# Patient Record
Sex: Male | Born: 1985 | Race: Black or African American | Hispanic: No | Marital: Single | Smoking: Never smoker
Health system: Southern US, Community
[De-identification: ages and names within clinical notes are randomized; demographics above are authoritative.]

## PROBLEM LIST (undated history)

## (undated) DIAGNOSIS — J302 Other seasonal allergic rhinitis: Secondary | ICD-10-CM

## (undated) HISTORY — PX: HERNIA REPAIR: SHX51

---
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
## 2006-05-04 ENCOUNTER — Emergency Department (HOSPITAL_COMMUNITY): Admission: EM | Admit: 2006-05-04 | Discharge: 2006-05-04 | Payer: Self-pay | Admitting: Emergency Medicine

## 2006-05-22 ENCOUNTER — Emergency Department (HOSPITAL_COMMUNITY): Admission: EM | Admit: 2006-05-22 | Discharge: 2006-05-22 | Payer: Self-pay | Admitting: Family Medicine

## 2006-07-13 ENCOUNTER — Ambulatory Visit (HOSPITAL_BASED_OUTPATIENT_CLINIC_OR_DEPARTMENT_OTHER): Admission: RE | Admit: 2006-07-13 | Discharge: 2006-07-13 | Payer: Self-pay | Admitting: General Surgery

## 2007-02-08 ENCOUNTER — Encounter: Admission: RE | Admit: 2007-02-08 | Discharge: 2007-02-08 | Payer: Self-pay | Admitting: General Surgery

## 2007-06-15 ENCOUNTER — Ambulatory Visit (HOSPITAL_BASED_OUTPATIENT_CLINIC_OR_DEPARTMENT_OTHER): Admission: RE | Admit: 2007-06-15 | Discharge: 2007-06-15 | Payer: Self-pay | Admitting: Urology

## 2008-04-06 ENCOUNTER — Encounter: Payer: Self-pay | Admitting: Emergency Medicine

## 2008-04-07 ENCOUNTER — Observation Stay (HOSPITAL_COMMUNITY): Admission: AC | Admit: 2008-04-07 | Discharge: 2008-04-07 | Payer: Self-pay
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
## 2010-09-12 ENCOUNTER — Emergency Department (HOSPITAL_COMMUNITY): Admission: EM | Admit: 2010-09-12 | Discharge: 2010-09-12 | Payer: Self-pay | Admitting: Emergency Medicine
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
## 2011-03-11 NOTE — Op Note (Signed)
NAME:  Stuart Mcdonald, Stuart Mcdonald NO.:  [id number]   MEDICAL RECORD NO.:  [id number]          PATIENT TYPE:  AMB   LOCATION:  [facility]                         FACILITY:  [facility]   PHYSICIAN:  Adolph Pollack, M.D.DATE OF BIRTH:  07/02/1986   DATE OF PROCEDURE:  07/13/2006  DATE OF DISCHARGE:                                 OPERATIVE REPORT   PREOPERATIVE DIAGNOSIS:  Right inguinal hernia.   POSTOPERATIVE DIAGNOSIS:  Indirect right inguinal hernia.   PROCEDURE:  Right inguinal hernia repair with mesh.   SURGEON:  Adolph Pollack, M.D.   ANESTHESIA:  General plus local (0.5% Marcaine with epinephrine).   INDICATIONS:  This is a 25 year old male who had been lifting weights and  noticed a bulge in the right groin that has become symptomatic.  He now  presents for repair.   TECHNIQUE:  He is seen in the holding area and the right groin marked with  my initials.  He is then brought to the operating room, placed supine on the  operating room and general anesthetic was administered.  Hair was clipped  from the right groin area and the area was sterilely prepped and draped.  Marcaine solution was infiltrated superficially and deep in the right groin  and right groin incision was made through the skin, subcutaneous tissue and  Scarpa's fascia exposing the external oblique aponeurosis.  Local anesthetic  was infiltrated deep to the external oblique aponeurosis.  An incision was  then made through external ring medially and up toward the anterior superior  iliac spine laterally.  Using blunt dissection, the internal oblique  aponeurosis was exposed superiorly.  The shelving edge of the inguinal  ligament exposed inferiorly.  The ileal, hypogastric and ilioinguinal nerves  were identified and preserved.   The spermatic cord was then isolated and a window created around it.  An  indirect sac was noted to be present and was fairly densely adherent to the  spermatic cord, thus I  had to do a fair amount of manipulation of the  spermatic cord to free up the indirect sac and then reduced to a patulous  internal ring.   Following this a 3 x 6 inch piece of polypropylene mesh was brought into the  field and anchored 1 cm medial to the pubic tubercle with a 2-0 Prolene  suture.  The inferior aspect of the mesh was then anchored to the shelving  edge of the inguinal ligament with running 2-0 Prolene suture up to a level  1-2 cm lateral to the internal ring.   A slit was cut in the mesh and the two tails were wrapped around the cord.  The superior aspect of the mesh was then anchored to the internal oblique  aponeurosis with interrupted 2-0 Vicryl sutures.  Two tails of the mesh were  then crossed creating a new internal ring and these were anchored to the  shelving edge of the inguinal ligament with a single 2-0 Prolene suture.  The new aperture allowed the introduction of the tip of the hemostat and the  cord was mobile.  The lateral aspect of the mesh was then tucked deep to the external oblique  aponeurosis.  Hemostasis was adequate.  I then closed the external oblique  aponeurosis over the mesh and cord with a running 3-0 Vicryl suture.  Scarpa's fascia was closed with running 2-0 Vicryl suture.  Skin was closed  with 4-0 Monocryl subcuticular stitch followed by Steri-Strips and sterile  dressing.   He tolerated the procedure without any apparent complications.  His right  testicle which was noted to be smaller and ride a little bit higher than the  left testicle was incised typical position in the scrotum.  He was then  taken to the recovery room in satisfactory condition.      Adolph Pollack, M.D.  Electronically Signed     TJR/MEDQ  D:  07/13/2006  T:  07/14/2006  Job:  [id number]

## 2011-07-21 LAB — CBC
HCT: 39
Hemoglobin: 13
MCV: 85.7
Platelets: 170
RDW: 13.7
WBC: 11 — ABNORMAL HIGH

## 2011-07-21 LAB — BASIC METABOLIC PANEL
BUN: 6
CO2: 24
Calcium: 9
Creatinine, Ser: 1.01
GFR calc Af Amer: >60
GFR calc non Af Amer: >60

## 2011-08-05 LAB — POCT HEMOGLOBIN-HEMACUE
Hemoglobin: 15
Operator id: 268271

## 2011-12-29 ENCOUNTER — Emergency Department (INDEPENDENT_AMBULATORY_CARE_PROVIDER_SITE_OTHER)
Admission: EM | Admit: 2011-12-29 | Discharge: 2011-12-29 | Disposition: A | Discharge status: Home / Self Care | Payer: Self-pay | Source: Home / Self Care | Attending: Emergency Medicine | Admitting: Emergency Medicine

## 2011-12-29 ENCOUNTER — Encounter (HOSPITAL_COMMUNITY): Payer: Self-pay | Admitting: *Deleted

## 2011-12-29 DIAGNOSIS — N342 Other urethritis: Secondary | ICD-10-CM

## 2011-12-29 LAB — HIV ANTIBODY (ROUTINE TESTING W REFLEX): HIV: NONREACTIVE

## 2011-12-29 MED ORDER — CEFTRIAXONE SODIUM 1 G IJ SOLR
INTRAMUSCULAR | Status: AC
  Filled 2011-12-29: qty 10

## 2011-12-29 MED ORDER — AZITHROMYCIN 250 MG PO TABS
1000.0000 mg | ORAL_TABLET | Freq: Once | ORAL | Status: AC
  Administered 2011-12-29: 1000 mg via ORAL

## 2011-12-29 MED ORDER — AZITHROMYCIN 250 MG PO TABS
ORAL_TABLET | ORAL | Status: AC
  Filled 2011-12-29: qty 4

## 2011-12-29 MED ORDER — CEFTRIAXONE SODIUM 250 MG IJ SOLR
250.0000 mg | Freq: Once | INTRAMUSCULAR | Status: AC
  Administered 2011-12-29: 250 mg via INTRAMUSCULAR

## 2011-12-29 NOTE — ED Notes (Signed)
Pt  States  He  Has  Burning on urination  As  Well  As   Penile discharge   Symptoms  Developed  Yesterday  Pt  Is  In no  Acute  distress  Sitting  Upright on  Exam  Table

## 2011-12-29 NOTE — Discharge Instructions (Signed)
Urethritis, Adult  Urethritis is an inflammation (soreness) of the urethra (the tube exiting from the bladder). It is often caused by germs that may be spread through sexual contact.  TREATMENT   Urethritis will usually respond to antibiotics. These are medications that kill germs. Take all the medicine given to you. You may feel better in a couple days, but TAKE ALL MEDICINE or the infection may not be completely cured and may become more difficult to treat. Response can generally be expected in 7 to 10 days. You may require additional treatment after more testing.  HOME CARE INSTRUCTIONS   Not have sex until the test results are known and treatment is completed.   Know that you may be asked to notify your sex partner when your final test results are back.   Finish all medications as prescribed.   Prevent sexually transmitted infections including AIDS. Practice safe sex. Use condoms.  SEEK MEDICAL CARE IF:    Your symptoms are not improved in 2 to 3 days.   Your symptoms are getting worse.   Your develop abdominal pain.   You develop joint pain.  SEEK IMMEDIATE MEDICAL CARE IF:    You have a fever.   You develop severe pain in the belly, back or side.   You develop repeated vomiting.  TEST RESULTS  Not all test results are available during your visit. If your test results are not back during the visit, make an appointment with your caregiver to find out the results. Do not assume everything is normal if you have not heard from your caregiver or the medical facility. It is important for you to follow-up on all of your test results.  Document Released: 04/05/2001 Document Revised: 09/29/2011 Document Reviewed: 10/26/2009  [employer] Patient Information 2012 [employer]

## 2011-12-29 NOTE — ED Provider Notes (Signed)
Chief Complaint  Patient presents with  . SEXUALLY TRANSMITTED DISEASE    History of Present Illness:   The patient is a 26 year old male who has had a two-day history of urethral discharge which is yellowish in color and pain with urination. He denies any lesions on the penis or testicular pain or swelling. He's had no fever, chills, adenopathy, joint pain, or skin rash. He denies any prior history of STDs. He was sexually active with a single male partner. His last intercourse was 2 weeks ago and he did not use a condom.  Review of Systems:  Other than noted above, the patient denies any of the following symptoms: Systemic:  No fevers chills, aches, weight loss, arthralgias, myalgias, or adenopathy. GI:  No abdominal pain, nausea or vomiting. GU:  No dysuria, penile pain, discharge, itching, dysuria, genital lesions, testicular pain or swelling. Skin:  No rash or itching.  PMFSH:  Past medical history, family history, social history, meds, and allergies were reviewed.  Physical Exam:   Vital signs:  BP 110/70  Pulse 70  Temp(Src) 98.6 F (37 C) (Oral)  Resp 16  SpO2 100% Gen:  Alert, oriented, in no distress. Abdomen:  Soft and flat, non-distended, and non-tender.  No organomegaly or mass. Genital:  There was a thick, yellow urethral discharge. No other lesions on the penis. No testicular swelling, pain, or mass, no inguinal lymphadenopathy. Skin:  Warm and dry.  No rash.   Course in Urgent Care Center:   A urethral swab was obtained for Chlamydia and gonorrhea DNA probe. Also obtained were serologies for HIV and syphilis. He was given Rocephin 250 mg IM and azithromycin 1000 mg by mouth. He tolerated these medications well without any apparent side effects.   Assessment:   Diagnoses that have been ruled out:  None  Diagnoses that are still under consideration:  None  Final diagnoses:  Urethritis - probably due to gonorrhea     Plan:   1.  The following meds were  prescribed:   New Prescriptions   No medications on file   2.  The patient was instructed in symptomatic care and handouts were given. 3.  The patient was told to return if becoming worse in any way, if no better in 3 or 4 days, and given some red flag symptoms that would indicate earlier return. 4.  The patient was instructed to inform all sexual contacts, avoid intercourse completely for 2 weeks and then only with a condom.  The patient was told that we would call about all abnormal lab results, and that we would need to report certain kinds of infection to the health department.    Roque Lias, MD 12/29/11 [phone]

## 2011-12-30 ENCOUNTER — Telehealth (HOSPITAL_COMMUNITY): Payer: Self-pay | Admitting: *Deleted

## 2011-12-30 LAB — GC/CHLAMYDIA PROBE AMP, GENITAL
Chlamydia, DNA Probe: POSITIVE — AB
GC Probe Amp, Genital: NEGATIVE

## 2011-12-30 NOTE — ED Notes (Addendum)
Pt. called back and verified x 2 and given results.  Pt. told he was adequately treated with Rocephin and Zithromax. Pt. instructed to notify their partner, no sex for 1 week and to practice safe sex. Pt. instructed he can come with picture ID Monday-Friday 11:30-1800 to view his HIV result. Vassie Moselle 12/30/2011

## 2011-12-30 NOTE — ED Notes (Signed)
GC neg. Chlamydia pos., HIV and RPR non-reactive.  Pt. Adequately treated with Rocephin and Zithromax. DHHS form completed and faxed to the [facility]. I called and left message for pt. to call. Stuart Mcdonald 12/30/2011

## 2012-01-02 NOTE — ED Notes (Signed)
1620  Pt. came in for HIV result. Pt. verified with picture ID and shown his result. Pt. instructed to get HIV rechecked in 6 mos. at the [facility]D clinic. Pt. voiced understanding. Vassie Moselle 01/02/2012

## 2012-01-29 ENCOUNTER — Emergency Department (INDEPENDENT_AMBULATORY_CARE_PROVIDER_SITE_OTHER)
Admission: EM | Admit: 2012-01-29 | Discharge: 2012-01-29 | Disposition: A | Discharge status: Home / Self Care | Payer: Self-pay | Source: Home / Self Care | Attending: Family Medicine | Admitting: Family Medicine

## 2012-01-29 ENCOUNTER — Encounter (HOSPITAL_COMMUNITY): Payer: Self-pay

## 2012-01-29 DIAGNOSIS — K027 Dental root caries: Secondary | ICD-10-CM

## 2012-01-29 DIAGNOSIS — K011 Impacted teeth: Secondary | ICD-10-CM

## 2012-01-29 DIAGNOSIS — K006 Disturbances in tooth eruption: Secondary | ICD-10-CM

## 2012-01-29 MED ORDER — AMOXICILLIN 875 MG PO TABS: 875.0000 mg | ORAL_TABLET | Freq: Two times a day (BID) | ORAL | Status: AC

## 2012-01-29 MED ORDER — MELOXICAM 15 MG PO TABS: 15.0000 mg | ORAL_TABLET | Freq: Every day | ORAL | Status: AC

## 2012-01-29 MED ORDER — KETOROLAC TROMETHAMINE 60 MG/2ML IM SOLN
60.0000 mg | Freq: Once | INTRAMUSCULAR | Status: AC
  Administered 2012-01-29: 60 mg via INTRAMUSCULAR

## 2012-01-29 MED ORDER — KETOROLAC TROMETHAMINE 60 MG/2ML IM SOLN
INTRAMUSCULAR | Status: AC
  Filled 2012-01-29: qty 2

## 2012-01-29 MED ORDER — HYDROCODONE-ACETAMINOPHEN 7.5-325 MG PO TABS: 1.0000 | ORAL_TABLET | Freq: Four times a day (QID) | ORAL | Status: AC | PRN
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
## 2012-01-29 NOTE — Discharge Instructions (Signed)
Dental Care and Dentist Visits Dental care supports good overall health. Regular dental visits can also help you avoid dental pain, bleeding, infection, and other more serious health problems in the future. It is important to keep the mouth healthy because diseases in the teeth, gums, and other oral tissues can spread to other areas of the body. Some problems, such as diabetes, heart disease, and pre-term labor have been associated with poor oral health.  See your dentist every 6 months. If you experience emergency problems such as a toothache or broken tooth, go to the dentist right away. If you see your dentist regularly, you may catch problems early. It is easier to be treated for problems in the early stages.  WHAT TO EXPECT AT A DENTIST VISIT  Your dentist will look for many common oral health problems and recommend proper treatment. At your regular dental visit, you can expect:  Gentle cleaning of the teeth and gums. This includes scraping and polishing. This helps to remove the sticky substance around the teeth and gums (plaque). Plaque forms in the mouth shortly after eating. Over time, plaque hardens on the teeth as tartar. If tartar is not removed regularly, it can cause problems. Cleaning also helps remove stains.   Periodic X-rays. These pictures of the teeth and supporting bone will help your dentist assess the health of your teeth.   Periodic fluoride treatments. Fluoride is a natural mineral shown to help strengthen teeth. Fluoride treatmentinvolves applying a fluoride gel or varnish to the teeth. It is most commonly done in children.   Examination of the mouth, tongue, jaws, teeth, and gums to look for any oral health problems, such as:   Cavities (dental caries). This is decay on the tooth caused by plaque, sugar, and acid in the mouth. It is best to catch a cavity when it is small.   Inflammation of the gums caused by plaque buildup (gingivitis).   Problems with the mouth or  malformed or misaligned teeth.   Oral cancer or other diseases of the soft tissues or jaws.  KEEP YOUR TEETH AND GUMS HEALTHY For healthy teeth and gums, follow these general guidelines as well as your dentist's specific advice:  Have your teeth professionally cleaned at the dentist every 6 months.   Brush twice daily with a fluoride toothpaste.   Floss your teeth daily.   Ask your dentist if you need fluoride supplements, treatments, or fluoride toothpaste.   Eat a healthy diet. Reduce foods and drinks with added sugar.   Avoid smoking.  TREATMENT FOR ORAL HEALTH PROBLEMS If you have oral health problems, treatment varies depending on the conditions present in your teeth and gums.  Your caregiver will most likely recommend good oral hygiene at each visit.   For cavities, gingivitis, or other oral health disease, your caregiver will perform a procedure to treat the problem. This is typically done at a separate appointment. Sometimes your caregiver will refer you to another dental specialist for specific tooth problems or for surgery.  SEEK IMMEDIATE DENTAL CARE IF:  You have pain, bleeding, or soreness in the gum, tooth, jaw, or mouth area.   A permanent tooth becomes loose or separated from the gum socket.   You experience a blow or injury to the mouth or jaw area.  Document Released: 06/22/2011 Document Revised: 09/29/2011 Document Reviewed: 06/22/2011 [facility] Patient Information 2012 [place], [place].Diet and Dental Disease What you eat affects the health of your teeth. Diet plays an important role in developing  healthy teeth and preventing dental disease, such as:  Tooth decay.   Gum (periodontal) disease.   Developmental defects of the enamel. This is when visible surfaces of the tooth do not form properly, leaving the tooth more prone to decay.   Dental erosion. This is when the teeth wear away.  Knowing which foods promote strong teeth and which foods to stay away  from can help you prevent poor oral health. If your diet lacks proper nutrients, it may be difficult for the tissues in your mouth to prevent dental disease. FOODS THAT PROMOTE DENTAL DISEASE The following foods either contain acids or create acid in your mouth that increases the risk of tooth decay:  Sugary foods, such as candy and baked goods (cookies, cake).   Soft drinks (carbonated and non-carbonated) such as soda, sports drinks, and fruit juice.   Citrus fruits, such as oranges and lemons.   Berries.   Honey.   Herbal teas that contain berries and other fruits.   Wines and other alcoholic beverages.   Vinegar or vinegar containing foods, such as pickles.   Starchy snacks such as crackers, potato chips, [place] fries, and pasta.  Some of these foods have health benefits. Eat these foods in moderation. The more often you eat these foods, the more frequently you are exposing your teeth to the acid that causes dental diseases. FOODS THAT REDUCE THE RISK OF DENTAL DISEASE Certain foods help to keep the teeth strong and reduce the risk of tooth decay. These foods include:  Dairy products, such as cow's milk and cheese. Eating dairy with a meal or sugary snack reduces the risk of tooth decay.   Gums and foods that substitute sugar with sorbitol, mannitol, and xylitol.   Fluoride containing foods, such as black tea. Fluoride is a natural mineral that protects the teeth from tooth decay. Your caregiver may recommend fluoride toothpaste or a fluoride supplement.   Breast milk.  DIETARY RECOMMENDATIONS FOR HEALTHY TEETH  Eat a healthy, well-balanced diet with fiber-rich fruits and vegetables and quality proteins (eggs, meat, poultry, and fish). A variety of foods each day in moderation is best.   Avoid frequent sugary snacks in between meals.   Avoid frequent sticky, chewy, sugary candies, such as gummy bears and other candies that stick to the teeth. Avoid sucking on candies for a  long time.   Avoid drinks that contain added sugar. Even though they do not sit in the mouth for very long, they can promote tooth decay if consumed too frequently.   Avoid sugary foods and drinks late at night.   Avoid swishing or holding acidic or sugary drinks in your mouth. Using a straw limits contact with the teeth.   If you like frequent sugary treats, try eating a sugary dessert after a meal or with a dairy product, rather than eating it by itself.   Avoid starchy foods such as graham crackers that stick to your teeth.   Eat highly acidic and sugary foods in moderation, especially if you tend to develop tooth decay. Eat citrus fruits or drinks 2 times per day or less. Limit foods with vinegar and sports drinks to 1 time per week.   Try rinsing your mouth with water after a sugary or acidic meal or drink. Rinsing may help to reduce the acid buildup in the mouth.   Limit alcohol.   Read labels to determine the amount of sugar in foods.  PRACTICE GOOD DAILY ORAL HYGIENE   Have your  teeth professionally cleaned at the dentist every 6 months.   Brush twice daily with a fluoride toothpaste.   Floss between your teeth daily.   Ask your caregiver if you need fluoride supplements or treatments.   Ask your caregiver if you should have sealants applied to some of your teeth.  HOME CARE INSTRUCTIONS  Follow the guidelines included here to promote good oral health.   Follow all of your caregiver's instructions for managing your health condition(s).   See your caregiver for follow-up exams as directed.  Document Released: 06/08/2011 Document Revised: 09/29/2011 Document Reviewed: 06/08/2011 [facility] Patient Information 2012 [place], [place].Dental Caries  Tooth decay (dental caries, cavities) is the most common of all oral diseases. It occurs in all ages but is more common in children and young adults.  CAUSES  Bacteria in your mouth combine with foods (particularly sugars and  starches) to produce plaque. Plaque is a substance that sticks to the hard surfaces of teeth. The bacteria in the plaque produce acids that attack the enamel of teeth. Repeated acid attacks dissolve the enamel and create holes in the teeth. Root surfaces of teeth may also get these holes.  Other contributing factors include:   Frequent snacking and drinking of cavity-producing foods and liquids.   Poor oral hygiene.   Dry mouth.   Substance abuse such as methamphetamine.   Broken or poor fitting dental restorations.   Eating disorders.   Gastroesophageal reflux disease (GERD).   Certain radiation treatments to the head and neck.  SYMPTOMS  At first, dental decay appears as white, chalky areas on the enamel. In this early stage, symptoms are seldom present. As the decay progresses, pits and holes may appear on the enamel surfaces. Progression of the decay will lead to softening of the hard layers of the tooth. At this point you may experience some pain or achy feeling after sweet, hot, or cold foods or drinks are consumed. If left untreated, the decay will reach the internal structures of the tooth and produce severe pain. Extensive dental treatment, such as root canal therapy, may be needed to save the tooth at this late stage of decay development.  DIAGNOSIS  Most cavities will be detected during regular check-ups. A thorough medical and dental history will be taken by the dentist. The dentist will use instruments to check the surfaces of your teeth for any breakdown or discoloration. Some dentists have special instruments, such as lasers, that detect tooth decay. Dental X-rays may also show some cavities that are not visible to the eye (such as between the contact areas of the teeth). TREATMENT  Treatment involves removal of the tooth decay and replacement with a restorative material such as silver, gold, or composite (white) material. However, if the decay involves a large area of the  tooth and there is little remaining healthy tooth structure, a cap (crown) will be fitted over the remaining structure. If the decay involves the center part of the tooth (pulp), root canal treatment will be needed before any type of dental restoration is placed. If the tooth is severely destroyed by the decay process, leaving the remaining tooth structures unrestorable, the tooth will need to be pulled (extracted). Some early tooth decay may be reversed by fluoride treatments and thorough brushing and flossing at home. PREVENTION   Eat healthy foods. Restrict the amount of sugary, starchy foods and liquids you consume. Avoid frequent snacking and drinking of unhealthy foods and liquids.   Sealants can help with prevention of  cavities. Sealants are composite resins applied onto the biting surfaces of teeth at risk for decay. They smooth out the pits and grooves and prevent food from being trapped in them. This is done in early childhood before tooth decay has started.   Fluoride tablets may also be prescribed to children between 6 months and 59 years of age if your drinking water is not fluoridated. The fluoride absorbed by the tooth enamel makes teeth less susceptible to decay. Thorough daily cleaning with a toothbrush and dental floss is the best way to prevent cavities. Use of a fluoride toothpaste is highly recommended. Fluoride mouth rinses may be used in specific cases.   Topical application of fluoride by your dentist is important in children.   Regular visits with a dentist for checkups and cleanings are also important.  SEEK IMMEDIATE DENTAL CARE IF:  You have a fever.   You develop redness and swelling of your face, jaw, or neck.   You develop swelling around a tooth.   You are unable to open your mouth or cannot swallow.   You have severe pain uncontrolled by pain medicine.  Document Released: 07/02/2002 Document Revised: 09/29/2011 Document Reviewed: 03/17/2011 [facility] Patient  Information 2012 [place], [place].

## 2012-01-29 NOTE — ED Provider Notes (Signed)
History     CSN: [id number]  Arrival date & time 01/29/12  1725   First MD Initiated Contact with Patient 01/29/12 1726      Chief Complaint  Patient presents with  . Dental Pain    toothache    (Consider location/radiation/quality/duration/timing/severity/associated sxs/prior treatment) Patient is a 26 y.o. male presenting with tooth pain. The history is provided by the patient.  Dental PainThe primary symptoms include mouth pain and oral lesions. The symptoms began 3 to 5 days ago. The symptoms are worsening. The symptoms are new.  Additional symptoms include: gum swelling, gum tenderness, facial swelling and swollen glands. Medical issues do not include: alcohol problem, smoking, chewing tobacco, periodontal disease and cancer.    History reviewed. No pertinent past medical history.  Past Surgical History  Procedure Date  . Hernia repair     No family history on file.  History  Substance Use Topics  . Smoking status: Never Smoker   . Smokeless tobacco: Not on file  . Alcohol Use: Yes      Review of Systems  Constitutional: Negative.   HENT: Positive for facial swelling. Negative for rhinorrhea and neck pain.   Neurological: Negative.   All other systems reviewed and are negative.    Allergies  Review of patient's allergies indicates no known allergies.  Home Medications   Current Outpatient Rx  Name Route Sig Dispense Refill  . IBUPROFEN 200 MG PO TABS Oral Take 200 mg by mouth every 6 (six) hours as needed.      BP 136/95  Pulse 84  Temp(Src) 100.9 F (38.3 C) (Oral)  Resp 18  SpO2 99%  Physical Exam  Constitutional: He is oriented to person, place, and time. He appears well-developed and well-nourished.  HENT:  Head: Normocephalic.  Mouth/Throat: Dental caries present.         Dental swelling and irritation over R upper wisdom tooth And cavity in upper R mouth  Eyes: Pupils are equal, round, and reactive to light.  Neck: Neck supple. No  tracheal deviation present. No thyromegaly present.  Neurological: He is alert and oriented to person, place, and time.  Skin: Skin is warm and dry.  Psychiatric: He has a normal mood and affect.    ED Course  Procedures (including critical care time)   Impacted wisdom teeth, dental caries, and dental infectiomn   MDM          Hassan Rowan, MD 01/29/12 2126

## 2017-02-02 ENCOUNTER — Encounter (HOSPITAL_COMMUNITY): Payer: Self-pay | Admitting: Emergency Medicine

## 2017-02-02 ENCOUNTER — Emergency Department (HOSPITAL_COMMUNITY)
Admission: EM | Admit: 2017-02-02 | Discharge: 2017-02-02 | Disposition: A | Discharge status: Home / Self Care | Payer: Self-pay | Attending: Emergency Medicine | Admitting: Emergency Medicine

## 2017-02-02 DIAGNOSIS — Z79899 Other long term (current) drug therapy: Secondary | ICD-10-CM | POA: Insufficient documentation

## 2017-02-02 DIAGNOSIS — R1111 Vomiting without nausea: Secondary | ICD-10-CM

## 2017-02-02 DIAGNOSIS — R1013 Epigastric pain: Secondary | ICD-10-CM | POA: Insufficient documentation

## 2017-02-02 DIAGNOSIS — R112 Nausea with vomiting, unspecified: Secondary | ICD-10-CM | POA: Insufficient documentation

## 2017-02-02 LAB — CBC WITH DIFFERENTIAL/PLATELET
BASOS ABS: 0 10*3/uL (ref 0.0–0.1)
Basophils Relative: 0 %
EOS PCT: 0 %
Eosinophils Absolute: 0 10*3/uL (ref 0.0–0.7)
HCT: 42.1 % (ref 39.0–52.0)
Hemoglobin: 14.7 g/dL (ref 13.0–17.0)
LYMPHS PCT: 10 %
Lymphs Abs: 1 10*3/uL (ref 0.7–4.0)
MCH: 29.2 pg (ref 26.0–34.0)
MCHC: 34.9 g/dL (ref 30.0–36.0)
MCV: 83.7 fL (ref 78.0–100.0)
MONO ABS: 0.4 10*3/uL (ref 0.1–1.0)
Monocytes Relative: 4 %
Neutro Abs: 8.8 10*3/uL — ABNORMAL HIGH (ref 1.7–7.7)
Neutrophils Relative %: 86 %
PLATELETS: 169 10*3/uL (ref 150–400)
RBC: 5.03 MIL/uL (ref 4.22–5.81)
RDW: 13.3 % (ref 11.5–15.5)
WBC: 10.2 10*3/uL (ref 4.0–10.5)

## 2017-02-02 LAB — COMPREHENSIVE METABOLIC PANEL
ALT: 19 U/L (ref 17–63)
ANION GAP: 10 (ref 5–15)
AST: 23 U/L (ref 15–41)
Albumin: 4.4 g/dL (ref 3.5–5.0)
Alkaline Phosphatase: 67 U/L (ref 38–126)
BUN: 6 mg/dL (ref 6–20)
CHLORIDE: 106 mmol/L (ref 101–111)
CO2: 23 mmol/L (ref 22–32)
Calcium: 9.2 mg/dL (ref 8.9–10.3)
Creatinine, Ser: 0.84 mg/dL (ref 0.61–1.24)
Glucose, Bld: 96 mg/dL (ref 65–99)
Potassium: 3.8 mmol/L (ref 3.5–5.1)
Sodium: 139 mmol/L (ref 135–145)
Total Bilirubin: 0.7 mg/dL (ref 0.3–1.2)
Total Protein: 7.8 g/dL (ref 6.5–8.1)

## 2017-02-02 LAB — LIPASE, BLOOD: LIPASE: 23 U/L (ref 11–51)

## 2017-02-02 MED ORDER — OMEPRAZOLE 20 MG PO CPDR: 20.0000 mg | DELAYED_RELEASE_CAPSULE | Freq: Two times a day (BID) | ORAL | 1 refills | Status: DC

## 2017-02-02 MED ORDER — ONDANSETRON 4 MG PO TBDP: 4.0000 mg | ORAL_TABLET | Freq: Three times a day (TID) | ORAL | 0 refills | Status: DC | PRN

## 2017-02-02 MED ORDER — SODIUM CHLORIDE 0.9 % IV BOLUS (SEPSIS)
1000.0000 mL | Freq: Once | INTRAVENOUS | Status: AC
  Administered 2017-02-02: 1000 mL via INTRAVENOUS

## 2017-02-02 MED ORDER — PANTOPRAZOLE SODIUM 40 MG IV SOLR
40.0000 mg | Freq: Once | INTRAVENOUS | Status: AC
Start: 2017-02-02 — End: 2017-02-02
  Administered 2017-02-02: 40 mg via INTRAVENOUS
  Filled 2017-02-02: qty 40

## 2017-02-02 NOTE — ED Notes (Signed)
Bed: WA19 Expected date:  Expected time:  Means of arrival:  Comments: 

## 2017-02-02 NOTE — ED Triage Notes (Signed)
Pt initially c/o abdominal pain and emesis due to drinking multiple beers and multiple shots last night, given Zofran  IV by EMS and now feeling better.

## 2017-02-02 NOTE — ED Notes (Signed)
RN WILL COLLECT LABS AT IV START

## 2017-02-02 NOTE — Discharge Instructions (Signed)
No alcohol, tobacco, anti-inflammatory meds, or caffeine.

## 2017-02-02 NOTE — ED Provider Notes (Addendum)
[facility]-EMERGENCY DEPT Provider Note   CSN: [id number] Arrival date & time: 02/02/17  1048     History   Chief Complaint Chief Complaint  Patient presents with  . Emesis    HPI Stuart Mcdonald is a 31 y.o. male.Chief complaint: abdominal pain  HPI:  Patient presents for evaluation of abdominal pain. States he went out and had several drinks last night. He drinks infrequently. He had vomiting last night. Awakened this morning and had several episodes of emesis and some upper abdominal pain and presents here. Does not drink daily or often. Occasional caffeine. He does not use anti-inflammatories. He smokes "okay" but no marijuana or cigarettes. No history of ulcers. Or past similar episodes.  Heme-negative nonbilious emesis  History reviewed. No pertinent past medical history.  There are no active problems to display for this patient.   Past Surgical History:  Procedure Laterality Date  . HERNIA REPAIR         Home Medications    Prior to Admission medications   Medication Sig Start Date End Date Taking? Authorizing Provider  ibuprofen (ADVIL,MOTRIN) 200 MG tablet Take 400 mg by mouth every 6 (six) hours as needed for fever, headache, mild pain, moderate pain or cramping.   Yes Historical Provider, MD  omeprazole (PRILOSEC) 20 MG capsule Take 1 capsule (20 mg total) by mouth 2 (two) times daily. 02/02/17   Rolland Porter, MD  ondansetron (ZOFRAN ODT) 4 MG disintegrating tablet Take 1 tablet (4 mg total) by mouth every 8 (eight) hours as needed for nausea. 02/02/17   Rolland Porter, MD    Family History History reviewed. No pertinent family history.  Social History Social History  Substance Use Topics  . Smoking status: Never Smoker  . Smokeless tobacco: Never Used  . Alcohol use Yes     Allergies   Patient has no known allergies.   Review of Systems Review of Systems  Constitutional: Negative for appetite change, chills, diaphoresis, fatigue and fever.  HENT: Negative  for mouth sores, sore throat and trouble swallowing.   Eyes: Negative for visual disturbance.  Respiratory: Negative for cough, chest tightness, shortness of breath and wheezing.   Cardiovascular: Negative for chest pain.  Gastrointestinal: Positive for abdominal pain, nausea and vomiting. Negative for abdominal distention and diarrhea.  Endocrine: Negative for polydipsia, polyphagia and polyuria.  Genitourinary: Negative for dysuria, frequency and hematuria.  Musculoskeletal: Negative for gait problem.  Skin: Negative for color change, pallor and rash.  Neurological: Negative for dizziness, syncope, light-headedness and headaches.  Hematological: Does not bruise/bleed easily.  Psychiatric/Behavioral: Negative for behavioral problems and confusion.     Physical Exam Updated Vital Signs BP 115/80 (BP Location: Left Arm)   Pulse 82   Temp 97.9 F (36.6 C) (Oral)   Resp 16   Ht  (1.651 m)   Wt 145 lb (65.8 kg)   SpO2 100%   BMI 24.13 kg/m   Physical Exam  Constitutional: He is oriented to person, place, and time. He appears well-developed and well-nourished. No distress.  HENT:  Head: Normocephalic.  Eyes: Conjunctivae are normal. Pupils are equal, round, and reactive to light. No scleral icterus.  Neck: Normal range of motion. Neck supple. No thyromegaly present.  Cardiovascular: Normal rate and regular rhythm.  Exam reveals no gallop and no friction rub.   No murmur heard. Pulmonary/Chest: Effort normal and breath sounds normal. No respiratory distress. He has no wheezes. He has no rales.  Abdominal: Soft. Bowel sounds are normal.  He exhibits no distension. There is no tenderness. There is no rebound.  Musculoskeletal: Normal range of motion.  Neurological: He is alert and oriented to person, place, and time.  Skin: Skin is warm and dry. No rash noted.  Psychiatric: He has a normal mood and affect. His behavior is normal.     ED Treatments / Results  Labs (all labs  ordered are listed, but only abnormal results are displayed) Labs Reviewed  CBC WITH DIFFERENTIAL/PLATELET - Abnormal; Notable for the following:       Result Value   Neutro Abs 8.8 (*)    All other components within normal limits  COMPREHENSIVE METABOLIC PANEL  LIPASE, BLOOD    EKG  EKG Interpretation None       Radiology No results found.  Procedures Procedures (including critical care time)  Medications Ordered in ED Medications  sodium chloride 0.9 % bolus 1,000 mL (1,000 mLs Intravenous New Bag/Given 02/02/17 1240)  pantoprazole (PROTONIX) injection 40 mg (40 mg Intravenous Given 02/02/17 1240)     Initial Impression / Assessment and Plan / ED Course  I have reviewed the triage vital signs and the nursing notes.  Pertinent labs & imaging results that were available during my care of the patient were reviewed by me and considered in my medical decision making (see chart for details).     Given IV fluids anti-medics. Feeling improved. Reassuring labs. No cirrhosis, hepatitis, pancreatitis. Likely alcohol gastritis. Plan PPI. When necessary Zofran.  Final Clinical Impressions(s) / ED Diagnoses   Final diagnoses:  Vomiting without nausea, intractability of vomiting not specified, unspecified vomiting type  Epigastric pain    New Prescriptions New Prescriptions   OMEPRAZOLE (PRILOSEC) 20 MG CAPSULE    Take 1 capsule (20 mg total) by mouth 2 (two) times daily.   ONDANSETRON (ZOFRAN ODT) 4 MG DISINTEGRATING TABLET    Take 1 tablet (4 mg total) by mouth every 8 (eight) hours as needed for nausea.     Rolland Porter, MD 02/02/17 1402    Rolland Porter, MD 02/02/17 [phone]

## 2017-10-05 ENCOUNTER — Encounter (HOSPITAL_COMMUNITY): Payer: Self-pay | Admitting: Emergency Medicine

## 2017-10-05 ENCOUNTER — Ambulatory Visit (HOSPITAL_COMMUNITY)
Admission: EM | Admit: 2017-10-05 | Discharge: 2017-10-05 | Disposition: A | Discharge status: Home / Self Care | Payer: Self-pay | Attending: Urgent Care | Admitting: Urgent Care

## 2017-10-05 DIAGNOSIS — J3489 Other specified disorders of nose and nasal sinuses: Secondary | ICD-10-CM

## 2017-10-05 DIAGNOSIS — J019 Acute sinusitis, unspecified: Secondary | ICD-10-CM

## 2017-10-05 DIAGNOSIS — G44209 Tension-type headache, unspecified, not intractable: Secondary | ICD-10-CM

## 2017-10-05 MED ORDER — KETOROLAC TROMETHAMINE 60 MG/2ML IM SOLN
60.0000 mg | Freq: Once | INTRAMUSCULAR | Status: AC
  Administered 2017-10-05: 60 mg via INTRAMUSCULAR

## 2017-10-05 MED ORDER — CYCLOBENZAPRINE HCL 5 MG PO TABS: 5.0000 mg | ORAL_TABLET | Freq: Three times a day (TID) | ORAL | 0 refills | Status: DC | PRN

## 2017-10-05 MED ORDER — AMOXICILLIN 500 MG PO CAPS: 500.0000 mg | ORAL_CAPSULE | Freq: Three times a day (TID) | ORAL | 0 refills | Status: DC

## 2017-10-05 MED ORDER — KETOROLAC TROMETHAMINE 60 MG/2ML IM SOLN
INTRAMUSCULAR | Status: AC
  Filled 2017-10-05: qty 2

## 2017-10-05 NOTE — ED Triage Notes (Signed)
Pt sts frequent HA since 11/10;  Pt sts left ear pain and back of head pain today

## 2017-10-05 NOTE — ED Provider Notes (Signed)
  MRN: [id number] DOB: 09/04/1986  Subjective:   Stuart Mcdonald is a 31 y.o. male presenting for 1 month history of persistent and worsening headaches. Headaches have caused pressure around both eyes, can be frontal, temporal or occipital, has had intermittent sinus pain. Now he is having left ear pain, nasal congestion, mild photophobia, mild dry cough, neck pain. Has tried Tylenol, Excedrin, ibuprofen with temporary relief only. Drinks ~1 bottle of water daily. Drinks sodas regularly. Has 1-2 meals per day. Sleeps ~5-6 hours per day, avoids sleeping but does not feel like he is sleep deprived. Denies fever, confusion, dizziness, sore throat. Denies history of migraines or history of headaches. Used to drink alcohol, stopped ~5 months ago. Denies smoking cigarettes.   Stuart Mcdonald is not currently taking any medications and has No Known Allergies.  Stuart Mcdonald has pmh of inguinal hernia and  has a past surgical history that includes Hernia repair.  Objective:   Vitals: BP (!) 143/87 (BP Location: Right Arm)   Pulse 97   Temp 98.6 F (37 C) (Oral)   Resp 18   SpO2 100%   Physical Exam  Constitutional: He is oriented to person, place, and time. He appears well-developed and well-nourished.  HENT:  TM's intact bilaterally, no effusions or erythema. Nasal turbinates pink and moist, nasal passages patent. No sinus tenderness. Oropharynx clear, mucous membranes moist.  Eyes: EOM are normal. Pupils are equal, round, and reactive to light. Right eye exhibits no discharge. Left eye exhibits no discharge.  Neck: Normal range of motion. Neck supple.  Cardiovascular: Normal rate, regular rhythm and intact distal pulses. Exam reveals no gallop and no friction rub.  No murmur heard. Pulmonary/Chest: No respiratory distress. He has no wheezes. He has no rales.  Lymphadenopathy:    He has no cervical adenopathy.  Neurological: He is alert and oriented to person, place, and time. He displays normal reflexes. No  cranial nerve deficit.  Skin: Skin is warm and dry.  Psychiatric: He has a normal mood and affect.   Assessment and Plan :   Acute non intractable tension-type headache  Sinus pain  Acute non-recurrent sinusitis, unspecified location   Will manage as a sinusitis with amoxicillin. Recommended lifestyle modifications in addition. Return-to-clinic precautions discussed, patient verbalized understanding.   Stuart BambergMario Patrcia Schnepp, PA-C [facility]  10/05/2017  4:20 PM   Stuart BambergMani, Osborne Serio, PA-C 10/05/17 1654

## 2017-10-05 NOTE — Discharge Instructions (Signed)
Hydrate well with at least 2 liters of water daily (1 gallon of water). Sleep 7-8 hours daily. Try to eat 3 regular, balanced meals. You may take 500mg  Tylenol with ibuprofen 600mg  with food every 6 hours for pain and inflammation.

## 2018-03-12 ENCOUNTER — Encounter (HOSPITAL_COMMUNITY): Payer: Self-pay | Admitting: Emergency Medicine

## 2018-03-12 ENCOUNTER — Ambulatory Visit (HOSPITAL_COMMUNITY)
Admission: EM | Admit: 2018-03-12 | Discharge: 2018-03-12 | Disposition: A | Discharge status: Home / Self Care | Payer: Self-pay | Attending: Family Medicine | Admitting: Family Medicine

## 2018-03-12 DIAGNOSIS — N509 Disorder of male genital organs, unspecified: Secondary | ICD-10-CM

## 2018-03-12 DIAGNOSIS — N5089 Other specified disorders of the male genital organs: Secondary | ICD-10-CM

## 2018-03-12 NOTE — ED Triage Notes (Signed)
Pt sts 2 small mass on testicles x several weeks with pain with sitting

## 2018-03-13 NOTE — ED Provider Notes (Signed)
[facility]   [id number] 03/12/18 Arrival Time: 1130  ASSESSMENT & PLAN:  1. Scrotal masses    Recommend that he get an U/S. Information given for him to schedule f/u appt with urology. He does not have a PCP currently. If any abrupt worsening he may go to the ED for evaluation and imaging. No sign of infectious etiology or testicular torsion.  Reviewed expectations re: course of current medical issues. Questions answered. Outlined signs and symptoms indicating need for more acute intervention. Patient verbalized understanding. After Visit Summary given.   SUBJECTIVE:  Stuart Mcdonald is a 32 y.o. male who reports noticing 'two small bumps' in R scrotum. Noticed first one a few weeks ago then found second one. No h/o similar.  Past Surgical History:  Procedure Laterality Date  . HERNIA REPAIR    as a child.  No teticular pain or swelling. Normal urination. No penile discharge. Lumps have not changed in size. No h/o similar. No specific aggravating or alleviating factors reported. No scrotal injury reported.  ROS: As per HPI.  OBJECTIVE:  Vitals:   03/12/18 1221  BP: (!) 128/95  Pulse: (!) 59  Resp: 18  Temp: 98.2 F (36.8 C)  TempSrc: Oral  SpO2: 99%     General appearance: alert, cooperative, appears stated age and no distress Throat: lips, mucosa, and tongue normal; teeth and gums normal Back: no CVA tenderness Abdomen: soft, non-tender; bowel sounds normal; no masses or organomegaly; no guarding or rebound tenderness GU: I can appreciate two small (~2-12mm maybe) masses in scrotum; these do not seem to be attached or part of his testicle; no pain with exam; no swelling; L testicle is smaller but he reports it has been since childhood Skin: warm and dry Psychological:  Alert and cooperative. Normal mood and affect.  No Known Allergies  Social History   Socioeconomic History  . Marital status: Single    Spouse name: Not on file  . Number of  children: Not on file  . Years of education: Not on file  . Highest education level: Not on file  Occupational History  . Not on file  Social Needs  . Financial resource strain: Not on file  . Food insecurity:    Worry: Not on file    Inability: Not on file  . Transportation needs:    Medical: Not on file    Non-medical: Not on file  Tobacco Use  . Smoking status: Never Smoker  . Smokeless tobacco: Never Used  Substance and Sexual Activity  . Alcohol use: Yes  . Drug use: No  . Sexual activity: Not on file  Lifestyle  . Physical activity:    Days per week: Not on file    Minutes per session: Not on file  . Stress: Not on file  Relationships  . Social connections:    Talks on phone: Not on file    Gets together: Not on file    Attends religious service: Not on file    Active member of club or organization: Not on file    Attends meetings of clubs or organizations: Not on file    Relationship status: Not on file  . Intimate partner violence:    Fear of current or ex partner: Not on file    Emotionally abused: Not on file    Physically abused: Not on file    Forced sexual activity: Not on file  Other Topics Concern  . Not on file  Social History  Narrative  . Not on file          Mardella Layman, MD 03/13/18 [phone]

## 2019-12-16 ENCOUNTER — Encounter (HOSPITAL_COMMUNITY): Payer: Self-pay

## 2019-12-16 ENCOUNTER — Ambulatory Visit (HOSPITAL_COMMUNITY)
Admission: EM | Admit: 2019-12-16 | Discharge: 2019-12-16 | Disposition: A | Discharge status: Home / Self Care | Payer: HRSA Program | Attending: Family Medicine | Admitting: Family Medicine

## 2019-12-16 ENCOUNTER — Other Ambulatory Visit: Payer: Self-pay

## 2019-12-16 DIAGNOSIS — Z20822 Contact with and (suspected) exposure to covid-19: Secondary | ICD-10-CM

## 2019-12-16 DIAGNOSIS — J069 Acute upper respiratory infection, unspecified: Secondary | ICD-10-CM | POA: Diagnosis present

## 2019-12-16 DIAGNOSIS — U071 COVID-19: Secondary | ICD-10-CM | POA: Diagnosis not present

## 2019-12-16 NOTE — Discharge Instructions (Signed)
Continue the mucinex. Drink plenty of water with this medication  Take tylenol 325mg  tablet x 2 up to every 6 hours for sore throat, fever and body aches. Do not exceed 8 tablets in 24 hours  Go directly to the Emergency Department or call 911 if you have severe chest pain, shortness of breath, severe diarrhea or feel as though you might pass out.    If your Covid-19 test is positive, you will receive a phone call from [facility] regarding your results. Negative test results are not called. Both positive and negative results area always visible on MyChart. If you do not have a MyChart account, sign up instructions are in your discharge papers.   Persons who are directed to care for themselves at home may discontinue isolation under the following conditions:   At least 10 days have passed since symptom onset and  At least 24 hours have passed without running a fever (this means without the use of fever-reducing medications) and  Other symptoms have improved.  Persons infected with COVID-19 who never develop symptoms may discontinue isolation and other precautions 10 days after the date of their first positive COVID-19 test.

## 2019-12-16 NOTE — ED Provider Notes (Signed)
[name]    CSN: [id number] Arrival date & time: 12/16/19  1158      History   Chief Complaint Chief Complaint  Patient presents with  . covid test    HPI Stuart Mcdonald is a 34 y.o. male.   Patient reports today to urgent care for a Covid test.  He reports nasal congestion, runny nose and cough that began 4 days ago on Thursday.  He notes though on Friday at work having body aches and chills.  He also endorses diarrhea on Friday that has since resolved.  He reports his cough is dry.  On Saturday which was 2 days ago he reports loss of taste and smell.  This is what prompted him to come in for testing.  He denies shortness of breath, chest pain, nausea, vomiting, headache, sore throat.  He has not had a measured fever but endorses the chills.  He has taken Mucinex for his cough and congestion and reports this is helped improve his cough.  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     History reviewed. No pertinent past medical history.  There are no problems to display for this patient.   Past Surgical History:  Procedure Laterality Date  . HERNIA REPAIR         Home Medications    Prior to Admission medications   Medication Sig Start Date End Date Taking? Authorizing Provider  amoxicillin (AMOXIL) 500 MG capsule Take 1 capsule (500 mg total) by mouth 3 (three) times daily. Patient not taking: Reported on 12/16/2019 10/05/17   Jaynee Eagles, PA-C  cyclobenzaprine (FLEXERIL) 5 MG tablet Take 1 tablet (5 mg total) by mouth 3 (three) times daily as needed for muscle spasms. Patient not taking: Reported on 12/16/2019 10/05/17   Jaynee Eagles, PA-C  ibuprofen (ADVIL,MOTRIN) 200 MG tablet Take 400 mg by mouth every 6 (six) hours as needed for fever, headache, mild pain, moderate pain or cramping.    [provider]  omeprazole (PRILOSEC) 20 MG capsule Take 1 capsule (20 mg  total) by mouth 2 (two) times daily. Patient not taking: Reported on 12/16/2019 02/02/17   Tanna Furry, MD  ondansetron (ZOFRAN ODT) 4 MG disintegrating tablet Take 1 tablet (4 mg total) by mouth every 8 (eight) hours as needed for nausea. Patient not taking: Reported on 12/16/2019 02/02/17   Tanna Furry, MD    Family History History reviewed. No pertinent family history.  Social History Social History   Tobacco Use  . Smoking status: Never Smoker  . Smokeless tobacco: Never Used  Substance Use Topics  . Alcohol use: Yes  . Drug use: No     Allergies   Patient has no known allergies.   Review of Systems Review of Systems  Constitutional: Positive for chills. Negative for fever.  HENT: Positive for congestion and rhinorrhea. Negative for ear pain, sinus pressure, sinus pain and sore throat.   Eyes: Negative for pain and visual disturbance.  Respiratory: Positive for cough. Negative for chest tightness and shortness of breath.   Cardiovascular: Negative for chest pain and palpitations.  Gastrointestinal: Positive for diarrhea. Negative for abdominal pain, nausea and vomiting.  Genitourinary: Negative for dysuria and hematuria.  Musculoskeletal: Positive for myalgias. Negative for arthralgias and back pain.  Skin: Negative for color change and rash.  Neurological: Negative for seizures, syncope and headaches.  All other systems reviewed and  are negative.    Physical Exam Triage Vital Signs ED Triage Vitals  Enc Vitals Group     BP 12/16/19 1221 135/79     Pulse Rate 12/16/19 1221 78     Resp 12/16/19 1221 16     Temp 12/16/19 1221 98.8 F (37.1 C)     Temp Source 12/16/19 1221 Oral     SpO2 12/16/19 1221 98 %     Weight 12/16/19 1219 157 lb 9.6 oz (71.5 kg)     Height --      Head Circumference --      Peak Flow --      Pain Score 12/16/19 1218 0     Pain Loc --      Pain Edu? --      Excl. in GC? --    No data found.  Updated Vital Signs BP 135/79 (BP  Location: Right Arm)   Pulse 78   Temp 98.8 F (37.1 C) (Oral)   Resp 16   Wt 157 lb 9.6 oz (71.5 kg)   SpO2 98%   BMI 26.23 kg/m   Visual Acuity Right Eye Distance:   Left Eye Distance:   Bilateral Distance:    Right Eye Near:   Left Eye Near:    Bilateral Near:     Physical Exam Vitals and nursing note reviewed.  Constitutional:      General: He is not in acute distress.    Appearance: Normal appearance. He is well-developed. He is not ill-appearing.  HENT:     Head: Normocephalic and atraumatic.     Nose: Nose normal. No congestion or rhinorrhea.     Mouth/Throat:     Mouth: Mucous membranes are moist.     Pharynx: Oropharynx is clear.  Eyes:     General: No scleral icterus.    Extraocular Movements: Extraocular movements intact.     Conjunctiva/sclera: Conjunctivae normal.     Pupils: Pupils are equal, round, and reactive to light.  Cardiovascular:     Rate and Rhythm: Normal rate and regular rhythm.     Heart sounds: No murmur.  Pulmonary:     Effort: Pulmonary effort is normal. No respiratory distress.     Breath sounds: Normal breath sounds. No wheezing, rhonchi or rales.  Musculoskeletal:        General: Normal range of motion.     Cervical back: Normal range of motion and neck supple.  Skin:    General: Skin is warm and dry.  Neurological:     General: No focal deficit present.     Mental Status: He is alert and oriented to person, place, and time.  Psychiatric:        Mood and Affect: Mood normal.        Behavior: Behavior normal.        Thought Content: Thought content normal.        Judgment: Judgment normal.      UC Treatments / Results  Labs (all labs ordered are listed, but only abnormal results are displayed) Labs Reviewed  NOVEL CORONAVIRUS, NAA (HOSP ORDER, SEND-OUT TO REF LAB; TAT 18-24 HRS)    EKG   Radiology No results found.  Procedures Procedures (including critical care time)  Medications Ordered in UC Medications - No  data to display  Initial Impression / Assessment and Plan / UC Course  I have reviewed the triage vital signs and the nursing notes.  Pertinent labs & imaging results that were available  during my care of the patient were reviewed by me and considered in my medical decision making (see chart for details).     #Viral URI Patient is a 34 year old male presenting with viral syndrome with associated loss of taste and smell that is suspicious for COVID-19.  Overall he is doing well and reports generalized improvements in his symptoms since onset.  We discussed symptomatic treatment and that his Covid test was sent. -Tylenol for body ache -Continue Mucinex -Isolation and emergency department return precautions were discussed   Final Clinical Impressions(s) / UC Diagnoses   Final diagnoses:  Acute upper respiratory infection  Encounter for laboratory testing for COVID-19 virus     Discharge Instructions     Continue the mucinex. Drink plenty of water with this medication  Take tylenol 325mg  tablet x 2 up to every 6 hours for sore throat, fever and body aches. Do not exceed 8 tablets in 24 hours  Go directly to the Emergency Department or call 911 if you have severe chest pain, shortness of breath, severe diarrhea or feel as though you might pass out.    If your Covid-19 test is positive, you will receive a phone call from [facility] regarding your results. Negative test results are not called. Both positive and negative results area always visible on MyChart. If you do not have a MyChart account, sign up instructions are in your discharge papers.   Persons who are directed to care for themselves at home may discontinue isolation under the following conditions:  . At least 10 days have passed since symptom onset and . At least 24 hours have passed without running a fever (this means without the use of fever-reducing medications) and . Other symptoms have improved.  Persons  infected with COVID-19 who never develop symptoms may discontinue isolation and other precautions 10 days after the date of their first positive COVID-19 test.      ED Prescriptions    None     PDMP not reviewed this encounter.   [facility], PA-C 12/16/19 1249

## 2019-12-16 NOTE — ED Triage Notes (Signed)
Pt states he has lost his sense of taste and smell x 4  Days. Pt state he wants a Covid test.

## 2019-12-18 LAB — NOVEL CORONAVIRUS, NAA (HOSP ORDER, SEND-OUT TO REF LAB; TAT 18-24 HRS): SARS-CoV-2, NAA: DETECTED — AB

## 2019-12-20 ENCOUNTER — Telehealth (HOSPITAL_COMMUNITY): Payer: Self-pay | Admitting: Emergency Medicine

## 2019-12-20 ENCOUNTER — Encounter (HOSPITAL_COMMUNITY): Payer: Self-pay

## 2019-12-20 NOTE — Telephone Encounter (Signed)

## 2020-08-22 ENCOUNTER — Ambulatory Visit (HOSPITAL_COMMUNITY)
Admission: EM | Admit: 2020-08-22 | Discharge: 2020-08-22 | Disposition: A | Discharge status: Home / Self Care | Payer: BC Managed Care – PPO | Attending: Internal Medicine | Admitting: Internal Medicine

## 2020-08-22 ENCOUNTER — Encounter (HOSPITAL_COMMUNITY): Payer: Self-pay | Admitting: *Deleted

## 2020-08-22 ENCOUNTER — Other Ambulatory Visit: Payer: Self-pay

## 2020-08-22 ENCOUNTER — Ambulatory Visit (INDEPENDENT_AMBULATORY_CARE_PROVIDER_SITE_OTHER): Payer: BC Managed Care – PPO

## 2020-08-22 DIAGNOSIS — S62310A Displaced fracture of base of second metacarpal bone, right hand, initial encounter for closed fracture: Secondary | ICD-10-CM | POA: Diagnosis not present

## 2020-08-22 DIAGNOSIS — S62330A Displaced fracture of neck of second metacarpal bone, right hand, initial encounter for closed fracture: Secondary | ICD-10-CM | POA: Diagnosis not present

## 2020-08-22 DIAGNOSIS — M79641 Pain in right hand: Secondary | ICD-10-CM

## 2020-08-22 DIAGNOSIS — M7989 Other specified soft tissue disorders: Secondary | ICD-10-CM | POA: Diagnosis not present

## 2020-08-22 DIAGNOSIS — S60221A Contusion of right hand, initial encounter: Secondary | ICD-10-CM | POA: Diagnosis not present

## 2020-08-22 MED ORDER — CYCLOBENZAPRINE HCL 10 MG PO TABS: 10.0000 mg | ORAL_TABLET | Freq: Two times a day (BID) | ORAL | 0 refills | Status: DC | PRN

## 2020-08-22 MED ORDER — IBUPROFEN 600 MG PO TABS: 600.0000 mg | ORAL_TABLET | Freq: Four times a day (QID) | ORAL | 0 refills | Status: DC | PRN

## 2020-08-22 NOTE — ED Triage Notes (Signed)
Pt reports injury to Rt hand occurred last night. Today the RT hand was swollen and pain present to Rt hand. Pt has full range of motion to All fingers rt hand . Radial pulses strong and skin is warm and dry.

## 2020-08-22 NOTE — Discharge Instructions (Signed)
You have broken a bone in your hand  We have splinted the joint to stabilize it  I have sent in ibuprofen for you to take one every 8 hours as needed for pain  I have sent in cyclobenzaprine for you to take twice a day as needed for muscle spasms  Follow up with orthopedics on Monday  Follow up with this office or with primary care if symptoms are persisting.  Follow up in the ER for high fever, trouble swallowing, trouble breathing, other concerning symptoms.
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
## 2020-08-22 NOTE — Progress Notes (Signed)
Orthopedic Tech Progress Note Patient Details:  Stuart Mcdonald 1986-06-19 [id number]  Ortho Devices Type of Ortho Device: Rad Gutter splint Ortho Device/Splint Location: RUE Ortho Device/Splint Interventions: Application, Ordered   Post Interventions Patient Tolerated: Well Instructions Provided: Care of device, Poper ambulation with device   [name] [name] 08/22/2020, 6:03 PM

## 2020-08-25 ENCOUNTER — Ambulatory Visit (INDEPENDENT_AMBULATORY_CARE_PROVIDER_SITE_OTHER): Payer: BC Managed Care – PPO | Admitting: Family Medicine

## 2020-08-25 ENCOUNTER — Other Ambulatory Visit: Payer: Self-pay

## 2020-08-25 VITALS — BP 102/80 | Ht 65.0 in | Wt 154.0 lb

## 2020-08-25 DIAGNOSIS — S92324A Nondisplaced fracture of second metatarsal bone, right foot, initial encounter for closed fracture: Secondary | ICD-10-CM | POA: Diagnosis not present
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
## 2020-08-25 NOTE — Patient Instructions (Addendum)
Follow up in one week for Splint removal, exam, and new X-rays.  Until then, avoid using your right hand as much as possible.  Take Ibuprofen as you need to for pain control (always with food on your stomach).

## 2020-08-26 ENCOUNTER — Encounter: Payer: Self-pay | Admitting: Family Medicine

## 2020-09-02 ENCOUNTER — Ambulatory Visit: Payer: BC Managed Care – PPO | Admitting: Family Medicine

## 2020-09-07 ENCOUNTER — Ambulatory Visit (INDEPENDENT_AMBULATORY_CARE_PROVIDER_SITE_OTHER): Payer: BC Managed Care – PPO | Admitting: Family Medicine

## 2020-09-07 ENCOUNTER — Other Ambulatory Visit: Payer: Self-pay

## 2020-09-07 ENCOUNTER — Ambulatory Visit
Admission: RE | Admit: 2020-09-07 | Discharge: 2020-09-07 | Disposition: A | Discharge status: Home / Self Care | Payer: BC Managed Care – PPO | Source: Ambulatory Visit | Attending: Family Medicine | Admitting: Family Medicine

## 2020-09-07 VITALS — BP 132/90 | Ht 65.0 in | Wt 154.0 lb

## 2020-09-07 DIAGNOSIS — S62360A Nondisplaced fracture of neck of second metacarpal bone, right hand, initial encounter for closed fracture: Secondary | ICD-10-CM

## 2020-09-07 DIAGNOSIS — S62390A Other fracture of second metacarpal bone, right hand, initial encounter for closed fracture: Secondary | ICD-10-CM | POA: Diagnosis not present

## 2020-09-07 NOTE — Patient Instructions (Signed)
Continue the splint until 11/26 then you can stop wearing this. At that point work on motion just flexing and extending your fingers - it's going to be very stiff and may take 1-2 weeks to get your motion back. If you're still struggling we can put you into hand therapy to work on this. You shouldn't need repeat x-rays going forward unless you fail to improve. Follow up with me in 2 weeks for reevaluation.

## 2020-09-08 ENCOUNTER — Encounter: Payer: Self-pay | Admitting: Family Medicine
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
## 2020-09-21 ENCOUNTER — Other Ambulatory Visit: Payer: Self-pay

## 2020-09-21 ENCOUNTER — Ambulatory Visit (INDEPENDENT_AMBULATORY_CARE_PROVIDER_SITE_OTHER): Payer: BC Managed Care – PPO | Admitting: Family Medicine

## 2020-09-21 VITALS — BP 132/92 | Ht 65.0 in | Wt 154.0 lb

## 2020-09-21 DIAGNOSIS — S62361D Nondisplaced fracture of neck of second metacarpal bone, left hand, subsequent encounter for fracture with routine healing: Secondary | ICD-10-CM | POA: Diagnosis not present

## 2020-09-21 NOTE — Patient Instructions (Signed)
Work on regaining motion then use a stress ball (or play-doh, putty) to help regain your strength. If you're struggling over the next 2 weeks with this call me and we can put an order in for hand therapy. Tylenol, ibuprofen only if needed. Follow up with me in 5-6 weeks or as needed if you're doing well.

## 2020-09-22 ENCOUNTER — Encounter: Payer: Self-pay | Admitting: Family Medicine
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
## 2021-01-25 ENCOUNTER — Encounter (HOSPITAL_COMMUNITY): Payer: Self-pay | Admitting: Emergency Medicine

## 2021-01-25 ENCOUNTER — Ambulatory Visit (HOSPITAL_COMMUNITY)
Admission: EM | Admit: 2021-01-25 | Discharge: 2021-01-25 | Disposition: A | Discharge status: Home / Self Care | Payer: BC Managed Care – PPO | Attending: Family Medicine | Admitting: Family Medicine

## 2021-01-25 ENCOUNTER — Other Ambulatory Visit: Payer: Self-pay

## 2021-01-25 DIAGNOSIS — J3089 Other allergic rhinitis: Secondary | ICD-10-CM

## 2021-01-25 DIAGNOSIS — R0789 Other chest pain: Secondary | ICD-10-CM

## 2021-01-25 HISTORY — DX: Other seasonal allergic rhinitis: J30.2

## 2021-01-25 MED ORDER — PREDNISONE 50 MG PO TABS: ORAL_TABLET | ORAL | 0 refills | Status: DC

## 2021-01-25 MED ORDER — FLUTICASONE PROPIONATE 50 MCG/ACT NA SUSP: 1.0000 | Freq: Two times a day (BID) | NASAL | 2 refills | Status: DC

## 2021-01-25 MED ORDER — CETIRIZINE HCL 10 MG PO TABS: 10.0000 mg | ORAL_TABLET | Freq: Every day | ORAL | 2 refills | Status: DC

## 2021-01-25 NOTE — ED Triage Notes (Signed)
Pt reports that he began to have sneezing, itchy eyes and scratchy throat today as he was unloading a truck at work.

## 2021-01-29 NOTE — ED Provider Notes (Signed)
[facility]    CSN: [id number] Arrival date & time: 01/25/21  1919      History   Chief Complaint Chief Complaint  Patient presents with  . Allergies  . Nasal Congestion  . Shortness of Breath    HPI Stuart Mcdonald is a 35 y.o. male.   Patient presenting today with sneezing, itchy eyes, scratchy throat, runny nose, chest tightness that has been ongoing for weeks now but much worse since unloading a truck outside today at work.  He states there was a lot of pollen around and he is significantly allergic to pollen.  Denies fever, chills, chest pain, shortness of breath, abdominal pain, nausea vomiting diarrhea, sick contacts.  Known history of seasonal allergies though does not take anything for this over-the-counter or otherwise.    Past Medical History:  Diagnosis Date  . Seasonal allergies     There are no problems to display for this patient.   Past Surgical History:  Procedure Laterality Date  . HERNIA REPAIR         Home Medications    Prior to Admission medications   Medication Sig Start Date End Date Taking? Authorizing Provider  cetirizine (ZYRTEC ALLERGY) 10 MG tablet Take 1 tablet (10 mg total) by mouth daily. 01/25/21  Yes Particia Nearing, PA-C  fluticasone [facility]) 50 MCG/ACT nasal spray Place 1 spray into both nostrils in the morning and at bedtime. 01/25/21  Yes Particia Nearing, PA-C  predniSONE (DELTASONE) 50 MG tablet Take 1 tablet daily with breakfast for 3 days 01/25/21  Yes Particia Nearing, PA-C  cyclobenzaprine (FLEXERIL) 10 MG tablet Take 1 tablet (10 mg total) by mouth 2 (two) times daily as needed for muscle spasms. Patient not taking: No sig reported 08/22/20   Moshe Cipro, NP  ibuprofen (ADVIL) 600 MG tablet Take 1 tablet (600 mg total) by mouth every 6 (six) hours as needed. 08/22/20   Moshe Cipro, NP    Family History Family History  Problem Relation Age of Onset  . Healthy Mother   . Healthy  Father     Social History Social History   Tobacco Use  . Smoking status: Never Smoker  . Smokeless tobacco: Never Used  Substance Use Topics  . Alcohol use: Yes  . Drug use: No     Allergies   Patient has no known allergies.   Review of Systems Review of Systems Per HPI  Physical Exam Triage Vital Signs ED Triage Vitals  Enc Vitals Group     BP 01/25/21 2001 (!) 134/98     Pulse Rate 01/25/21 2001 69     Resp 01/25/21 2001 20     Temp 01/25/21 2001 98.1 F (36.7 C)     Temp Source 01/25/21 2001 Oral     SpO2 01/25/21 2001 97 %     Weight --      Height --      Head Circumference --      Peak Flow --      Pain Score 01/25/21 2003 0     Pain Loc --      Pain Edu? --      Excl. in GC? --    No data found.  Updated Vital Signs BP (!) 134/98 (BP Location: Right Arm)   Pulse 69   Temp 98.1 F (36.7 C) (Oral)   Resp 20   SpO2 97%   Visual Acuity Right Eye Distance:   Left Eye Distance:  Bilateral Distance:    Right Eye Near:   Left Eye Near:    Bilateral Near:     Physical Exam Vitals and nursing note reviewed.  Constitutional:      Appearance: Normal appearance.  HENT:     Head: Atraumatic.     Right Ear: Tympanic membrane normal.     Left Ear: Tympanic membrane normal.     Nose: Rhinorrhea present.     Comments: Nasal turbinates significantly erythematous and edematous bilaterally    Mouth/Throat:     Mouth: Mucous membranes are moist.     Pharynx: Posterior oropharyngeal erythema present. No oropharyngeal exudate.  Eyes:     Extraocular Movements: Extraocular movements intact.     Conjunctiva/sclera: Conjunctivae normal.  Cardiovascular:     Rate and Rhythm: Normal rate and regular rhythm.     Heart sounds: Normal heart sounds.  Pulmonary:     Effort: Pulmonary effort is normal. No respiratory distress.     Breath sounds: Normal breath sounds. No wheezing or rales.  Abdominal:     General: Bowel sounds are normal. There is no  distension.     Palpations: Abdomen is soft.     Tenderness: There is no abdominal tenderness. There is no guarding.  Musculoskeletal:        General: Normal range of motion.     Cervical back: Normal range of motion and neck supple.  Skin:    General: Skin is warm and dry.  Neurological:     General: No focal deficit present.     Mental Status: He is oriented to person, place, and time.  Psychiatric:        Mood and Affect: Mood normal.        Thought Content: Thought content normal.        Judgment: Judgment normal.      UC Treatments / Results  Labs (all labs ordered are listed, but only abnormal results are displayed) Labs Reviewed - No data to display  EKG   Radiology No results found.  Procedures Procedures (including critical care time)  Medications Ordered in UC Medications - No data to display  Initial Impression / Assessment and Plan / UC Course  I have reviewed the triage vital signs and the nursing notes.  Pertinent labs & imaging results that were available during my care of the patient were reviewed by me and considered in my medical decision making (see chart for details).     Exacerbation of seasonal allergies due to pollen.  Discussed importance of consistent use of allergy regimen to prevent/control symptoms.  Will treat with short prednisone burst to get control of symptoms and meanwhile start daily Zyrtec and Flonase regimen that he knows to continue even once symptoms improve to prevent further symptoms.  Return for acutely worsening symptoms.  Final Clinical Impressions(s) / UC Diagnoses   Final diagnoses:  Seasonal allergic rhinitis due to other allergic trigger  Chest tightness   Discharge Instructions   None    ED Prescriptions    Medication Sig Dispense Auth. Provider   predniSONE (DELTASONE) 50 MG tablet Take 1 tablet daily with breakfast for 3 days 3 tablet Particia Nearing, PA-C   cetirizine (ZYRTEC ALLERGY) 10 MG tablet  Take 1 tablet (10 mg total) by mouth daily. 30 tablet Particia Nearing, PA-C   fluticasone [facility]) 50 MCG/ACT nasal spray Place 1 spray into both nostrils in the morning and at bedtime. 16 g Particia Nearing, [place]  PDMP not reviewed this encounter.   Particia Nearing, [place] 01/29/21 0745

## 2022-03-29 ENCOUNTER — Encounter (HOSPITAL_COMMUNITY): Payer: Self-pay | Admitting: Emergency Medicine

## 2022-03-29 ENCOUNTER — Ambulatory Visit (HOSPITAL_COMMUNITY)
Admission: EM | Admit: 2022-03-29 | Discharge: 2022-03-29 | Disposition: A | Discharge status: Home / Self Care | Payer: 59 | Attending: Physician Assistant | Admitting: Physician Assistant

## 2022-03-29 DIAGNOSIS — M25512 Pain in left shoulder: Secondary | ICD-10-CM | POA: Diagnosis not present

## 2022-03-29 DIAGNOSIS — M79605 Pain in left leg: Secondary | ICD-10-CM | POA: Diagnosis not present

## 2022-03-29 DIAGNOSIS — S46912A Strain of unspecified muscle, fascia and tendon at shoulder and upper arm level, left arm, initial encounter: Secondary | ICD-10-CM

## 2022-03-29 MED ORDER — IBUPROFEN 800 MG PO TABS: 800.0000 mg | ORAL_TABLET | Freq: Three times a day (TID) | ORAL | 1 refills | Status: DC

## 2022-03-29 NOTE — Discharge Instructions (Signed)
Advised take ibuprofen 800 mg 1 every 8 hours with food to try to reduce the pain. Advised to try to change the position of his work since it is a repetitive motion that this is aggravating his shoulder and leg pain. Advised to make an appointment with orthopedics for evaluation if symptoms fail to improve over the next 10 to 14 days.

## 2022-03-29 NOTE — ED Provider Notes (Signed)
[facility]    CSN: [id number] Arrival date & time: 03/29/22  1046      History   Chief Complaint Chief Complaint  Patient presents with   Shoulder Pain   Leg Pain    HPI Stuart Mcdonald is a 36 y.o. male.   36 year old male presents with left shoulder pain left leg pain.  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  Patient does this motion 10 hours a day 4 days a week.  Patient has been working with his present physician for the past 2 months.  Patient relates over the past several days he has been having increasing left shoulder pain discomfort, he relates that intermittent numbness and tingling which runs down the shoulder into his hand, without weakness.  Patient relates the results start to in the neck as it moves into the shoulder.  Patient relates he has similar symptoms in the left leg to where he gets numbness and tingling which travels down the lower back and left leg to the ankle and foot area, this is intermittent also.  Patient relates he has taken some ibuprofen 200 mg which has given some relief of symptoms patient relates he has not had any injuries to the shoulder for the back left leg recently.  Patient does relate that he had left shoulder injury earlier as a teenager playing football.  No shortness of breath, chest pain, fever or chills.   Shoulder Pain Leg Pain  Past Medical History:  Diagnosis Date   Seasonal allergies     There are no problems to display for this patient.   Past Surgical History:  Procedure Laterality Date   HERNIA REPAIR         Home Medications    Prior to Admission medications   Medication Sig Start Date End Date Taking? Authorizing Provider  ibuprofen (ADVIL) 800 MG tablet Take 1 tablet (800 mg total) by mouth 3 (three) times daily. 03/29/22   Yes Ellsworth Lennox, PA-C  cetirizine (ZYRTEC ALLERGY) 10 MG tablet Take 1 tablet (10 mg total) by mouth daily. 01/25/21   Particia Nearing, PA-C  cyclobenzaprine (FLEXERIL) 10 MG tablet Take 1 tablet (10 mg total) by mouth 2 (two) times daily as needed for muscle spasms. Patient not taking: Reported on 09/07/2020 08/22/20   Moshe Cipro, NP  fluticasone [facility]) 50 MCG/ACT nasal spray Place 1 spray into both nostrils in the morning and at bedtime. 01/25/21   Particia Nearing, PA-C  predniSONE (DELTASONE) 50 MG tablet Take 1 tablet daily with breakfast for 3 days 01/25/21   Particia Nearing, PA-C    Family History Family History  Problem Relation Age of Onset   Healthy Mother    Healthy Father     Social History Social History   Tobacco Use   Smoking status: Never   Smokeless tobacco: Never  Substance Use Topics   Alcohol use: Yes   Drug use: No     Allergies   Patient has no known allergies.   Review of Systems Review of Systems  Musculoskeletal:  Positive for arthralgias (Left Shoulder and Left Leg tingling).    Physical Exam Triage Vital Signs ED Triage Vitals  Enc Vitals Group     BP 03/29/22 1157 (!) 153/107     Pulse  Rate 03/29/22 1157 61     Resp 03/29/22 1157 16     Temp 03/29/22 1157 98.3 F (36.8 C)     Temp Source 03/29/22 1157 Oral     SpO2 03/29/22 1157 97 %     Weight --      Height --      Head Circumference --      Peak Flow --      Pain Score 03/29/22 1203 4     Pain Loc --      Pain Edu? --      Excl. in GC? --    No data found.  Updated Vital Signs BP (!) 153/107 (BP Location: Right Arm)   Pulse 61   Temp 98.3 F (36.8 C) (Oral)   Resp 16   SpO2 97%   Visual Acuity Right Eye Distance:   Left Eye Distance:   Bilateral Distance:    Right Eye Near:   Left Eye Near:    Bilateral Near:     Physical Exam Constitutional:      Appearance: Normal appearance.  HENT:     Right Ear: Tympanic membrane and ear canal  normal.     Left Ear: Tympanic membrane and ear canal normal.     Mouth/Throat:     Mouth: Mucous membranes are moist.     Pharynx: Oropharynx is clear. Uvula midline.  Cardiovascular:     Rate and Rhythm: Normal rate and regular rhythm.     Heart sounds: Normal heart sounds.  Pulmonary:     Effort: Pulmonary effort is normal.     Breath sounds: Normal breath sounds and air entry. No wheezing, rhonchi or rales.  Abdominal:     General: Abdomen is flat. Bowel sounds are normal.     Palpations: Abdomen is soft.     Tenderness: There is no abdominal tenderness.  Musculoskeletal:     Left shoulder: Normal. No tenderness. Normal range of motion.     Cervical back: Normal range of motion.     Left lower leg: Normal. No swelling.     Comments: Left shoulder: There is no tenderness palpated along the left shoulder trapezius area deltoid, no swelling range of motion is intact, stability is intact, strength is normal with resistance abduction and abduction. Left leg: Full range of motion is intact, stability is intact, negative straight leg raise.  Strength is 5/5 as compared to right.  Lymphadenopathy:     Cervical: No cervical adenopathy.  Neurological:     Mental Status: He is alert.     UC Treatments / Results  Labs (all labs ordered are listed, but only abnormal results are displayed) Labs Reviewed - No data to display  EKG   Radiology No results found.  Procedures Procedures (including critical care time)  Medications Ordered in UC Medications - No data to display  Initial Impression / Assessment and Plan / UC Course  I have reviewed the triage vital signs and the nursing notes.  Pertinent labs & imaging results that were available during my care of the patient were reviewed by me and considered in my medical decision making (see chart for details).    Plan: 1.  Patient advised to take ibuprofen 800 mg 1 every 8 hours with food to help reduce the pain in the  shoulder. 2.  Patient advised to use ice, Tylenol, 3-4 times a day. 3.  Patient advised to see orthopedic if the pain fails to improve with the ibuprofen.  Final Clinical Impressions(s) / UC Diagnoses   Final diagnoses:  Acute pain of left shoulder  Strain of left shoulder, initial encounter  Left leg pain     Discharge Instructions      Advised take ibuprofen 800 mg 1 every 8 hours with food to try to reduce the pain. Advised to try to change the position of his work since it is a repetitive motion that this is aggravating his shoulder and leg pain. Advised to make an appointment with orthopedics for evaluation if symptoms fail to improve over the next 10 to 14 days.    ED Prescriptions     Medication Sig Dispense Auth. Provider   ibuprofen (ADVIL) 800 MG tablet Take 1 tablet (800 mg total) by mouth 3 (three) times daily. 21 tablet Ellsworth Lennox, PA-C      PDMP not reviewed this encounter.   Ellsworth Lennox, PA-C 03/29/22 1305

## 2022-03-29 NOTE — ED Triage Notes (Signed)
Patient c/o LFT sided shoulder pain x 1 week.   Patient c/o LFT leg pain x 1 day.   Patient endorses onset of symptoms began at work. 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   Patient endorses worsening symptoms " when at work, or throughout the day even when I'm at home".   Patient endorses sharp pain in LFT chest pain.   Patient endorses history of pinched nerve.

## 2023-11-13 ENCOUNTER — Ambulatory Visit (HOSPITAL_COMMUNITY)
Admit: 2023-11-13 | Discharge: 2023-11-13 | Disposition: A | Discharge status: Home / Self Care | Payer: Self-pay | Attending: Family Medicine | Admitting: Family Medicine

## 2023-11-13 ENCOUNTER — Ambulatory Visit (HOSPITAL_COMMUNITY)
Admission: EM | Admit: 2023-11-13 | Discharge: 2023-11-13 | Disposition: A | Discharge status: Home / Self Care | Payer: Self-pay | Attending: Family Medicine | Admitting: Family Medicine

## 2023-11-13 ENCOUNTER — Encounter (HOSPITAL_COMMUNITY): Payer: Self-pay

## 2023-11-13 DIAGNOSIS — N50812 Left testicular pain: Secondary | ICD-10-CM | POA: Insufficient documentation

## 2023-11-13 DIAGNOSIS — N433 Hydrocele, unspecified: Secondary | ICD-10-CM | POA: Insufficient documentation

## 2023-11-13 DIAGNOSIS — Z711 Person with feared health complaint in whom no diagnosis is made: Secondary | ICD-10-CM | POA: Insufficient documentation

## 2023-11-13 LAB — HIV ANTIBODY (ROUTINE TESTING W REFLEX): HIV Screen 4th Generation wRfx: NONREACTIVE

## 2023-11-13 NOTE — ED Triage Notes (Signed)
Patient states he has not had sex for 7 years. Patient states he was sexually active over the weekend and noticed some discomfort near his left testicle.  Patient is concern about STI. Denies any penile discharge or swelling.

## 2023-11-13 NOTE — ED Provider Notes (Signed)
[facility]    CSN: [id number] Arrival date & time: 11/13/23  1146      History   Chief Complaint No chief complaint on file.   HPI Stuart Mcdonald is a 38 y.o. male.   HPI Patient presents today for evaluation of left testicular pain. Patient endorses recent sexual activity after a 7 year period of abstinence. He reports pain onset one day after intercourse. Denies any heavy lifting. Endorses this same type of pain occurred several years ago and resolved on its own without any medical evaluation.  Past Medical History:  Diagnosis Date   Seasonal allergies     There are no active problems to display for this patient.   Past Surgical History:  Procedure Laterality Date   HERNIA REPAIR         Home Medications    Prior to Admission medications   Medication Sig Start Date End Date Taking? Authorizing Provider  cetirizine (ZYRTEC ALLERGY) 10 MG tablet Take 1 tablet (10 mg total) by mouth daily. 01/25/21   Particia Nearing, PA-C  cyclobenzaprine (FLEXERIL) 10 MG tablet Take 1 tablet (10 mg total) by mouth 2 (two) times daily as needed for muscle spasms. Patient not taking: Reported on 09/07/2020 08/22/20   Moshe Cipro, FNP  fluticasone [facility]) 50 MCG/ACT nasal spray Place 1 spray into both nostrils in the morning and at bedtime. 01/25/21   Particia Nearing, PA-C  ibuprofen (ADVIL) 800 MG tablet Take 1 tablet (800 mg total) by mouth 3 (three) times daily. 03/29/22   Ellsworth Lennox, PA-C  predniSONE (DELTASONE) 50 MG tablet Take 1 tablet daily with breakfast for 3 days 01/25/21   Particia Nearing, PA-C    Family History Family History  Problem Relation Age of Onset   Healthy Mother    Healthy Father     Social History Social History   Tobacco Use   Smoking status: Never   Smokeless tobacco: Never  Substance Use Topics   Alcohol use: Yes   Drug use: No     Allergies   Patient has no known allergies.   Review of Systems Review  of Systems Pertinent negatives listed in HPI   Physical Exam Triage Vital Signs ED Triage Vitals  Encounter Vitals Group     BP 11/13/23 1439 (!) 155/121     Systolic BP Percentile --      Diastolic BP Percentile --      Pulse Rate 11/13/23 1439 89     Resp 11/13/23 1439 16     Temp 11/13/23 1439 98.1 F (36.7 C)     Temp Source 11/13/23 1439 Oral     SpO2 11/13/23 1439 97 %     Weight --      Height --      Head Circumference --      Peak Flow --      Pain Score 11/13/23 1445 7     Pain Loc --      Pain Education --      Exclude from Growth Chart --    No data found.  Updated Vital Signs BP (!) 155/121 (BP Location: Left Arm)   Pulse 89   Temp 98.1 F (36.7 C) (Oral)   Resp 16   SpO2 97%   Visual Acuity Right Eye Distance:   Left Eye Distance:   Bilateral Distance:    Right Eye Near:   Left Eye Near:    Bilateral Near:  Physical Exam Vitals reviewed. Exam conducted with a chaperone present.  Constitutional:      Appearance: Normal appearance.  HENT:     Head: Normocephalic and atraumatic.  Eyes:     Extraocular Movements: Extraocular movements intact.     Conjunctiva/sclera: Conjunctivae normal.     Pupils: Pupils are equal, round, and reactive to light.  Cardiovascular:     Rate and Rhythm: Normal rate and regular rhythm.  Pulmonary:     Effort: Pulmonary effort is normal.     Breath sounds: Normal breath sounds.  Genitourinary:    Penis: Normal.      Testes:        Left: Testicular hydrocele present.       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  Palpable cyst confirmed by ultrasound to be a hydrocele and left testicle  Skin:    General: Skin is warm.  Neurological:     General: No focal deficit present.     Mental Status: He is alert.      UC Treatments / Results  Labs (all labs ordered are listed, but only abnormal  results are displayed) Labs Reviewed  HIV ANTIBODY (ROUTINE TESTING W REFLEX)  RPR  CYTOLOGY, (ORAL, ANAL, URETHRAL) ANCILLARY ONLY    EKG   Radiology No results found.  Procedures Procedures (including critical care time)  Medications Ordered in UC Medications - No data to display  Initial Impression / Assessment and Plan / UC Course  I have reviewed the triage vital signs and the nursing notes.  Pertinent labs & imaging results that were available during my care of the patient were reviewed by me and considered in my medical decision making (see chart for details).     Testicular pain left, obtained a ultrasound scrotum Doppler which was significant for a left testicle hydrocele, referred to urology and confirmed that he has transportation to go to [facility] urology in [place]. Patient recently had unprotected sex, obtain STD testing. No treatment will await test results given no known exposure.  Called patient via phone and discussed plan and he is that urology will reach out to him to schedule an appointment. Final Clinical Impressions(s) / UC Diagnoses   Final diagnoses:  Testicular pain, left  Concern about STD in male without diagnosis   Discharge Instructions   None    ED Prescriptions   None    PDMP not reviewed this encounter.   Bing Neighbors, NP 11/13/23 1830    Bing Neighbors, NP 11/15/23 [phone]

## 2023-11-13 NOTE — Discharge Instructions (Addendum)
We will notify you of your results and any additional follow-up.  Your ultrasound was positive for hydrocele (fluid collection) around the left testicle. I have referred you to Urology in [facility] through [facility], there office will call to schedule an appointment.

## 2023-11-14 LAB — CYTOLOGY, (ORAL, ANAL, URETHRAL) ANCILLARY ONLY
Chlamydia: NEGATIVE
Comment: NEGATIVE
Comment: NEGATIVE
Comment: NORMAL
Neisseria Gonorrhea: NEGATIVE
Trichomonas: NEGATIVE

## 2023-11-14 LAB — RPR: RPR Ser Ql: NONREACTIVE

## 2023-11-15 ENCOUNTER — Telehealth (HOSPITAL_COMMUNITY): Payer: Self-pay | Admitting: *Deleted

## 2023-11-15 ENCOUNTER — Telehealth (HOSPITAL_COMMUNITY): Payer: Self-pay

## 2023-11-15 NOTE — Telephone Encounter (Signed)
Per K. Harris, Np, "I received your message regarding the referral for this patient the referral was placed or only able to see the referral being that it was an external referral if you open the encounter on the date patient was seen it is under the dispo screening and it was placed as I was speaking to him on the phone. I have confirmed that the referral is still in the system no one probably has had an opportunity to reach out to him as it has not been a full 48 hours. He was not referred to [facility] urology he was referred to [facility] urology in [place] as he does not have insurance and will need financial assistance. You can give him the number to [facility] urology he can call them directly to inquire about his referral. Referrals placed and our system at urgent care will only show up on the disposition screen and not under the referrals tab as it is sent externally. [facility] [phone]"  Pt updated. Verbalized understanding.

## 2023-11-15 NOTE — Telephone Encounter (Signed)
Gave pt [facility] information. Sent message to triage phone number to enter referral as well.

## 2023-11-15 NOTE — Telephone Encounter (Signed)
Pt called [facility] regarding Urology referral.  Per provider note, "Called patient via phone and discussed plan and he is that urology will reach out to him to schedule an appointment." No referral seen in [facility]. Hulan Saas, CMA gave pt contact information for [facility]. Pt is still asking for referral. Please advise. Thanks!

## 2023-11-17 ENCOUNTER — Encounter (HOSPITAL_COMMUNITY): Payer: Self-pay | Admitting: Emergency Medicine

## 2023-11-17 ENCOUNTER — Ambulatory Visit (HOSPITAL_COMMUNITY)
Admission: EM | Admit: 2023-11-17 | Discharge: 2023-11-17 | Disposition: A | Discharge status: Home / Self Care | Payer: Self-pay | Attending: Emergency Medicine | Admitting: Emergency Medicine

## 2023-11-17 DIAGNOSIS — J039 Acute tonsillitis, unspecified: Secondary | ICD-10-CM | POA: Insufficient documentation

## 2023-11-17 LAB — POCT MONO SCREEN (KUC): Mono, POC: NEGATIVE

## 2023-11-17 LAB — POCT RAPID STREP A (OFFICE): Rapid Strep A Screen: NEGATIVE

## 2023-11-17 MED ORDER — PREDNISOLONE SODIUM PHOSPHATE 15 MG/5ML PO SOLN
30.0000 mg | Freq: Once | ORAL | Status: AC
  Administered 2023-11-17: 30 mg via ORAL

## 2023-11-17 MED ORDER — AMOXICILLIN-POT CLAVULANATE 875-125 MG PO TABS: 1.0000 | ORAL_TABLET | Freq: Two times a day (BID) | ORAL | 0 refills | Status: DC

## 2023-11-17 MED ORDER — PREDNISOLONE SODIUM PHOSPHATE 15 MG/5ML PO SOLN
ORAL | Status: AC
  Filled 2023-11-17: qty 2

## 2023-11-17 NOTE — Discharge Instructions (Addendum)
Your strep testing was negative, I sent this off for culture.  Your mono testing is negative.  We obtained a cytology swab of your throat and will contact if this comes back positive as we will need to modify your treatment plan.  The steroids we gave you should help with any discomfort, inflammation and swelling.  You can alternate between Tylenol and Motrin every 4-6 hours if you develop any pain.  Take the antibiotics twice daily with food as prescribed and until finished.  Return to clinic for any new or urgent symptoms.

## 2023-11-17 NOTE — ED Triage Notes (Signed)
Patient states his tonsils are swollen which started today. Denies pain or any other complaints at this time.

## 2023-11-17 NOTE — ED Provider Notes (Signed)
[facility]    CSN: [id number] Arrival date & time: 11/17/23  1550      History   Chief Complaint Chief Complaint  Patient presents with   Oral Swelling    HPI Stuart Mcdonald is a 38 y.o. male.   Patient presents to clinic for concern over sore throat and white patches that he noticed today. He had normal appetitte this morning, had three slices of pizza and a soda without any issues or pain. Woke up from a nap and his throat felt scratchy so he drank some water, when that did not help he took his phone flashlight and looked into his mouth and noticed a lot of white patches on his tonsils.   No pain. No fevers. No trouble swallowing. Felt like his voice has been hoarse for the past two weeks. Niece and nephew are young. No recent sick contacts.   Last sexually active Saturday with oral intercourse. Recently had negative penile STI screening on 11/12/22.   The history is provided by the patient and medical records.    Past Medical History:  Diagnosis Date   Seasonal allergies     There are no active problems to display for this patient.   Past Surgical History:  Procedure Laterality Date   HERNIA REPAIR         Home Medications    Prior to Admission medications   Medication Sig Start Date End Date Taking? Authorizing Provider  amoxicillin-clavulanate (AUGMENTIN) 875-125 MG tablet Take 1 tablet by mouth every 12 (twelve) hours. 11/17/23  Yes Rinaldo Ratel, [place] N, FNP  ibuprofen (ADVIL) 800 MG tablet Take 1 tablet (800 mg total) by mouth 3 (three) times daily. 03/29/22  Yes Ellsworth Lennox, PA-C    Family History Family History  Problem Relation Age of Onset   Healthy Mother    Healthy Father     Social History Social History   Tobacco Use   Smoking status: Never   Smokeless tobacco: Never  Substance Use Topics   Alcohol use: Yes    Comment: social   Drug use: No     Allergies   Patient has no known allergies.   Review of Systems Review of  Systems  Per HPI  Physical Exam Triage Vital Signs ED Triage Vitals  Encounter Vitals Group     BP 11/17/23 1630 (!) 152/97     Systolic BP Percentile --      Diastolic BP Percentile --      Pulse Rate 11/17/23 1630 77     Resp 11/17/23 1630 18     Temp 11/17/23 1630 98.5 F (36.9 C)     Temp Source 11/17/23 1630 Oral     SpO2 11/17/23 1630 100 %     Weight --      Height --      Head Circumference --      Peak Flow --      Pain Score 11/17/23 1633 0     Pain Loc --      Pain Education --      Exclude from Growth Chart --    No data found.  Updated Vital Signs BP (!) 152/97 (BP Location: Right Arm)   Pulse 77   Temp 98.5 F (36.9 C) (Oral)   Resp 18   SpO2 100%   Visual Acuity Right Eye Distance:   Left Eye Distance:   Bilateral Distance:    Right Eye Near:   Left Eye Near:  Bilateral Near:     Physical Exam Vitals and nursing note reviewed.  Constitutional:      Appearance: Normal appearance.  HENT:     Head: Normocephalic and atraumatic.     Right Ear: External ear normal.     Left Ear: External ear normal.     Nose: Nose normal.     Mouth/Throat:     Mouth: Mucous membranes are moist.     Pharynx: Posterior oropharyngeal erythema present.     Tonsils: Tonsillar exudate present. 2+ on the right. 2+ on the left.  Eyes:     Conjunctiva/sclera: Conjunctivae normal.  Cardiovascular:     Rate and Rhythm: Normal rate.  Pulmonary:     Effort: Pulmonary effort is normal. No respiratory distress.  Musculoskeletal:        General: Normal range of motion.  Skin:    General: Skin is warm and dry.  Neurological:     General: No focal deficit present.     Mental Status: He is alert.  Psychiatric:        Mood and Affect: Mood normal.        Behavior: Behavior is cooperative.      UC Treatments / Results  Labs (all labs ordered are listed, but only abnormal results are displayed) Labs Reviewed  POCT RAPID STREP A (OFFICE) - Normal  CULTURE, GROUP  A STREP ([facility])  POCT MONO SCREEN ([facility])  CYTOLOGY, (ORAL, ANAL, URETHRAL) ANCILLARY ONLY    EKG   Radiology No results found.  Procedures Procedures (including critical care time)  Medications Ordered in UC Medications  prednisoLONE (ORAPRED) 15 MG/5ML solution 30 mg (30 mg Oral Given 11/17/23 1707)    Initial Impression / Assessment and Plan / UC Course  I have reviewed the triage vital signs and the nursing notes.  Pertinent labs & imaging results that were available during my care of the patient were reviewed by me and considered in my medical decision making (see chart for details).  Vitals and triage reviewed, patient is hemodynamically stable.  Able to speak in full sentences, some voice changes. Bilateral tonsillar edema is 2+ with diffuse white/gray exudate.  POC rapid strep negative, will send for culture.  Monospot testing is negative.  Patient is not complaining of any pain, fevers or fatigue.  He recently had oral intercourse a few days ago.  Oral cytology collected.  Will treat for tonsillitis with Augmentin.  Oral prednisolone given in clinic for pain and inflammation.  Plan of care, follow-up care return precautions given, no questions at this time.     Final Clinical Impressions(s) / UC Diagnoses   Final diagnoses:  Tonsillitis     Discharge Instructions      Your strep testing was negative, I sent this off for culture.  Your mono testing is negative.  We obtained a cytology swab of your throat and will contact if this comes back positive as we will need to modify your treatment plan.  The steroids we gave you should help with any discomfort, inflammation and swelling.  You can alternate between Tylenol and Motrin every 4-6 hours if you develop any pain.  Take the antibiotics twice daily with food as prescribed and until finished.  Return to clinic for any new or urgent symptoms.      ED Prescriptions     Medication Sig Dispense Auth. Provider    amoxicillin-clavulanate (AUGMENTIN) 875-125 MG tablet Take 1 tablet by mouth every 12 (twelve) hours. 14 tablet Rinaldo Ratel,  [place] N, FNP      PDMP not reviewed this encounter.   Mykah Shin, [place] N, [place] 11/17/23 [phone]

## 2023-11-20 LAB — CYTOLOGY, (ORAL, ANAL, URETHRAL) ANCILLARY ONLY
Chlamydia: NEGATIVE
Comment: NEGATIVE
Comment: NORMAL
Neisseria Gonorrhea: NEGATIVE

## 2023-11-20 LAB — CULTURE, GROUP A STREP (THRC)

## 2023-11-24 ENCOUNTER — Encounter (HOSPITAL_COMMUNITY): Payer: Self-pay

## 2023-11-24 ENCOUNTER — Ambulatory Visit (HOSPITAL_COMMUNITY)
Admission: EM | Admit: 2023-11-24 | Discharge: 2023-11-24 | Disposition: A | Discharge status: Home / Self Care | Payer: Self-pay | Attending: Emergency Medicine | Admitting: Emergency Medicine

## 2023-11-24 DIAGNOSIS — J039 Acute tonsillitis, unspecified: Secondary | ICD-10-CM

## 2023-11-24 MED ORDER — CLINDAMYCIN HCL 300 MG PO CAPS: 300.0000 mg | ORAL_CAPSULE | Freq: Three times a day (TID) | ORAL | 0 refills | Status: AC

## 2023-11-24 NOTE — ED Provider Notes (Signed)
[facility]    CSN: [id number] Arrival date & time: 11/24/23  1545      History   Chief Complaint Chief Complaint  Patient presents with   tonsil issue   Otalgia   Cough    HPI Stuart Mcdonald is a 39 y.o. male.   Patient presents with persistent tonsillar swelling and exudate after being treated with Augmentin for tonsillitis.  Patient also endorses dry cough and left ear pain.   Otalgia Associated symptoms: cough and sore throat   Associated symptoms: no congestion and no fever   Cough Associated symptoms: ear pain and sore throat   Associated symptoms: no chills and no fever     Past Medical History:  Diagnosis Date   Seasonal allergies     There are no active problems to display for this patient.   Past Surgical History:  Procedure Laterality Date   HERNIA REPAIR         Home Medications    Prior to Admission medications   Medication Sig Start Date End Date Taking? Authorizing Provider  clindamycin (CLEOCIN) 300 MG capsule Take 1 capsule (300 mg total) by mouth 3 (three) times daily for 10 days. 11/24/23 12/04/23 Yes Letta Kocher, NP    Family History Family History  Problem Relation Age of Onset   Healthy Mother    Healthy Father     Social History Social History   Tobacco Use   Smoking status: Never   Smokeless tobacco: Never  Vaping Use   Vaping status: Never Used  Substance Use Topics   Alcohol use: Yes    Comment: social   Drug use: No     Allergies   Patient has no known allergies.   Review of Systems Review of Systems  Constitutional:  Negative for chills, fatigue and fever.  HENT:  Positive for ear pain and sore throat. Negative for congestion.   Respiratory:  Positive for cough.      Physical Exam Triage Vital Signs ED Triage Vitals  Encounter Vitals Group     BP 11/24/23 1813 124/81     Systolic BP Percentile --      Diastolic BP Percentile --      Pulse Rate 11/24/23 1813 75     Resp 11/24/23  1813 16     Temp 11/24/23 1813 98.9 F (37.2 C)     Temp Source 11/24/23 1813 Oral     SpO2 11/24/23 1813 96 %     Weight --      Height --      Head Circumference --      Peak Flow --      Pain Score 11/24/23 1748 4     Pain Loc --      Pain Education --      Exclude from Growth Chart --    No data found.  Updated Vital Signs BP 124/81 (BP Location: Right Arm)   Pulse 75   Temp 98.9 F (37.2 C) (Oral)   Resp 16   SpO2 96%   Visual Acuity Right Eye Distance:   Left Eye Distance:   Bilateral Distance:    Right Eye Near:   Left Eye Near:    Bilateral Near:     Physical Exam Vitals and nursing note reviewed.  Constitutional:      General: He is awake. He is not in acute distress.    Appearance: Normal appearance. He is well-developed and well-groomed. He is not ill-appearing.  HENT:     Right Ear: Tympanic membrane, ear canal and external ear normal.     Left Ear: Tympanic membrane, ear canal and external ear normal.     Mouth/Throat:     Pharynx: Pharyngeal swelling, oropharyngeal exudate and posterior oropharyngeal erythema present.     Tonsils: Tonsillar exudate present.  Neurological:     Mental Status: He is alert.  Psychiatric:        Behavior: Behavior is cooperative.      UC Treatments / Results  Labs (all labs ordered are listed, but only abnormal results are displayed) Labs Reviewed - No data to display  EKG   Radiology No results found.  Procedures Procedures (including critical care time)  Medications Ordered in UC Medications - No data to display  Initial Impression / Assessment and Plan / UC Course  I have reviewed the triage vital signs and the nursing notes.  Pertinent labs & imaging results that were available during my care of the patient were reviewed by me and considered in my medical decision making (see chart for details).     Patient presented with persistent tonsillar swelling and exudate after being treated with  Augmentin for tonsillitis.  Patient also endorses dry cough and left ear pain.  Upon assessment patient has pharyngeal swelling with or pharyngeal and tonsillar exudate and erythema present.  Patient was tested.  Strep, STD, and mono during prior visit which all came back negative.  Prescribed clindamycin for persistent exudative tonsillitis.  Discussed return precautions. Final Clinical Impressions(s) / UC Diagnoses   Final diagnoses:  Exudative tonsillitis     Discharge Instructions      Start taking clindamycin 3 times daily for 10 days for tonsillitis.  Alternate between Tylenol and ibuprofen as needed for pain.  Return here if symptoms persist.    ED Prescriptions     Medication Sig Dispense Auth. Provider   clindamycin (CLEOCIN) 300 MG capsule Take 1 capsule (300 mg total) by mouth 3 (three) times daily for 10 days. 30 capsule Wynonia Lawman A, NP      PDMP not reviewed this encounter.   Wynonia Lawman A, NP 11/24/23 [phone]

## 2023-11-24 NOTE — Discharge Instructions (Signed)
Start taking clindamycin 3 times daily for 10 days for tonsillitis.  Alternate between Tylenol and ibuprofen as needed for pain.  Return here if symptoms persist.

## 2023-11-24 NOTE — ED Triage Notes (Signed)
Patient was seen in the ED 10/2423 and was diagnosed with tonsilitis. Patient states he took the Amoxicillin as prescribed and had  slight decreased swelling, but white patches remain. Patient denies any pain or difficulty swelling.  Patient also c/o left ear pain and a non productive cough.

## 2023-12-05 ENCOUNTER — Encounter: Payer: Self-pay | Admitting: Urology

## 2023-12-05 ENCOUNTER — Telehealth: Payer: Self-pay

## 2023-12-05 ENCOUNTER — Ambulatory Visit (INDEPENDENT_AMBULATORY_CARE_PROVIDER_SITE_OTHER): Payer: Self-pay | Admitting: Urology

## 2023-12-05 VITALS — BP 136/100 | HR 76 | Ht 65.0 in | Wt 137.8 lb

## 2023-12-05 DIAGNOSIS — N451 Epididymitis: Secondary | ICD-10-CM

## 2023-12-05 DIAGNOSIS — R3129 Other microscopic hematuria: Secondary | ICD-10-CM

## 2023-12-05 DIAGNOSIS — N50812 Left testicular pain: Secondary | ICD-10-CM

## 2023-12-05 LAB — URINALYSIS, COMPLETE
Bilirubin, UA: NEGATIVE
Glucose, UA: NEGATIVE
Ketones, UA: NEGATIVE
Leukocytes,UA: NEGATIVE
Nitrite, UA: NEGATIVE
Protein,UA: NEGATIVE
Specific Gravity, UA: 1.02 (ref 1.005–1.030)
Urobilinogen, Ur: 0.2 mg/dL (ref 0.2–1.0)
pH, UA: 6 (ref 5.0–7.5)

## 2023-12-05 LAB — MICROSCOPIC EXAMINATION

## 2023-12-05 NOTE — Telephone Encounter (Signed)
Called pt informed him that per Dr. Apolinar Junes UA from today showed microscopic blood, informed pt that she recommends a repeat UA in 1 month. Pt voiced understanding. Appt scheduled. Pt made aware that further blood may warrant further testing.

## 2023-12-05 NOTE — Progress Notes (Signed)
I, Amy L Pierron, acting as a scribe for Vanna Scotland, MD.,have documented all relevant documentation on the behalf of Vanna Scotland, MD,as directed by  Vanna Scotland, MD while in the presence of Vanna Scotland, MD.  12/05/2023 2:57 PM   Stuart Mcdonald 10-19-1986 [id number]  Referring provider: Bing Neighbors, NP [address] [place],  [place] [address]  Chief Complaint  Patient presents with   Testicle Pain    HPI: 38 year-old male presents today to establish care for left testicular pain.   He was seen in urgent care on 11/13/2023 with left-sided pain. He'd recently been sexually active after a period of abstinence. On exam supposedly hydrocele was noted.He was screened for STIs which was negative. He did have a scrotal ultrasound that showed a small left hydrocele and mild right testicular atrophy.   Urinalysis today and awaiting result.   He mentions having a sharp pain every once in a while. He was able to get the swelling down by applying ice packs daily and taking ibuprofen. Denies urinary symptoms or taking antibiotics.   He did have tonsillitis which he needed antibiotics for and the testicular pain subsided shortly after taking them.   He had this happen in 2016 but it was worse and had more swelling which was on both sides. Each time it happened was a day or so after intercourse.   PMH: Past Medical History:  Diagnosis Date   Seasonal allergies     Surgical History: Past Surgical History:  Procedure Laterality Date   HERNIA REPAIR      Home Medications:  Allergies as of 12/05/2023   No Known Allergies      Medication List    as of December 05, 2023  2:57 PM   You have not been prescribed any medications.     Family History: Family History  Problem Relation Age of Onset   Healthy Mother    Healthy Father     Social History:  reports that he has never smoked. He has never used smokeless tobacco. He reports current alcohol use. He reports  that he does not use drugs.   Physical Exam: BP (!) 136/100 (BP Location: Left Arm, Patient Position: Sitting, Cuff Size: Normal)   Pulse 76   Ht 5\' 5"  (1.651 m)   Wt 137 lb 12.8 oz (62.5 kg)   BMI 22.93 kg/m   Constitutional:  Alert and oriented, No acute distress. HEENT: [place] AT, moist mucus membranes.  Trachea midline, no masses. GU: Slightly prominent left epididymis which was minimally tender but more pronounced on the right. No significant clinical hydrocele and no inguinal hernias. Right testicle was slightly atrophic. Normal circumcised phallus and no masses appreciated.  Neurologic: Grossly intact, no focal deficits, moving all 4 extremities. Psychiatric: Normal mood and affect.  Pertinent Imaging: Narrative & Impression  CLINICAL DATA:  Left-sided testicular pain for the past 3 days. No injury.   EXAM: SCROTAL ULTRASOUND   DOPPLER ULTRASOUND OF THE TESTICLES   TECHNIQUE: Complete ultrasound examination of the testicles, epididymis, and other scrotal structures was performed. Color and spectral Doppler ultrasound were also utilized to evaluate blood flow to the testicles.   COMPARISON:  CT pelvis dated February 08, 2007.   FINDINGS: Right testicle   Measurements: 2.8 x 1.5 x 2.2 cm. No mass or microlithiasis visualized.   Left testicle   Measurements: 4.2 x 1.8 x 2.7 cm. No mass or microlithiasis visualized.   Right epididymis: Normal in size and  appearance. 3 mm cyst in the epididymal head and tail.   Left epididymis:  Normal in size and appearance.   Hydrocele:  Small left hydrocele.   Varicocele:  None visualized.   Pulsed Doppler interrogation of both testes demonstrates normal low resistance arterial and venous waveforms bilaterally.   IMPRESSION: 1. Small left hydrocele. 2. Mild right testicular atrophy.   Electronically Signed   By: Obie Dredge M.D.   On: 11/13/2023 16:32  Personally reviewed the above images and agree with radiologic  interpretation.    Assessment & Plan:    1. Probably left epididymitis  - Swelling was primarily centralized over epididymis. May be related to reflux. There is not a clear infectious cause. Recommend supportive care. Will inform the office if it occurs again.  2. Incidental microscopic hematuria -May be secondary to #1 -Noted after patient left clinic.  He was called and have schedule urine drop on next month for repeat.  If still has blood at that point, would recommend hematuria eval  Return if symptoms worsen or fail to improve.  I have reviewed the above documentation for accuracy and completeness, and I agree with the above.   Vanna Scotland, MD   [facility] [address], [address] [place], [place] [address] [phone]

## 2023-12-21 ENCOUNTER — Ambulatory Visit: Payer: Self-pay | Admitting: Urology

## 2024-01-02 ENCOUNTER — Other Ambulatory Visit: Payer: Self-pay

## 2024-01-02 ENCOUNTER — Encounter: Payer: Self-pay | Admitting: Urology

## 2024-04-02 ENCOUNTER — Other Ambulatory Visit: Payer: Self-pay

## 2024-04-02 ENCOUNTER — Ambulatory Visit (HOSPITAL_COMMUNITY)
Admission: EM | Admit: 2024-04-02 | Discharge: 2024-04-02 | Disposition: A | Discharge status: Home / Self Care | Payer: Self-pay | Attending: Family Medicine | Admitting: Family Medicine

## 2024-04-02 ENCOUNTER — Encounter (HOSPITAL_COMMUNITY): Payer: Self-pay | Admitting: Emergency Medicine

## 2024-04-02 DIAGNOSIS — Z113 Encounter for screening for infections with a predominantly sexual mode of transmission: Secondary | ICD-10-CM

## 2024-04-02 DIAGNOSIS — R300 Dysuria: Secondary | ICD-10-CM

## 2024-04-02 LAB — POCT URINALYSIS DIP (MANUAL ENTRY)
Bilirubin, UA: NEGATIVE
Glucose, UA: NEGATIVE mg/dL
Ketones, POC UA: NEGATIVE mg/dL
Leukocytes, UA: NEGATIVE
Nitrite, UA: NEGATIVE
Protein Ur, POC: NEGATIVE mg/dL
Spec Grav, UA: 1.01 (ref 1.010–1.025)
Urobilinogen, UA: 1 U/dL
pH, UA: 6.5 (ref 5.0–8.0)

## 2024-04-02 NOTE — Discharge Instructions (Addendum)
 Urinalysis done today is negative for infection however there is a trace amount of red blood cells.  This could be due to irritation of the urethra.  We will send the urine off for a urine culture.  If this does show bacteria in the urine, we will contact you to start antibiotics. This normally takes 24-48 hours to get results. Screening swab done today and results will be available in 24-48 hours. We will contact you if we need to arrange additional treatment based on your testing. Negative results will be on your MyChart account. Abstain from sex until you receive your final results.  Use a condom for sexual encounters.  Make sure to stay hydrated and drink plenty of water.

## 2024-04-02 NOTE — ED Triage Notes (Addendum)
 03/27/24 noticed stinging while urinating.  Patient has noticed a yellowish discharge.  Patient feels a stinging or pinching pain when sitting

## 2024-04-02 NOTE — ED Provider Notes (Addendum)
 [facility]    CSN: [id number] Arrival date & time: 04/02/24  1630      History   Chief Complaint Chief Complaint  Patient presents with   SEXUALLY TRANSMITTED DISEASE    HPI Stuart Mcdonald is a 38 y.o. male.   38 year old male who presents urgent care with complaints of burning pain when he urinates.  He reports that about 2 weeks ago he had unprotected sex.  Then on Wednesday he began to develop significant burning when he peed.  This lasted about 2 days.  He now continues to have an odd sensation at his urethra.  He is also noted that first thing in the morning he has an unusual sensation and has noticed when he pinches the end of his penis he gets a small amount of yellowish discharge.  He denies any severe pain.  He has noted some left groin pain but it is more of a soreness not severe.  It is not all the time.  He denies any fevers, chills, nausea, vomiting, diarrhea, constipation, hematuria.  He does not have any known STD exposure with his significant other.     Past Medical History:  Diagnosis Date   Seasonal allergies     There are no active problems to display for this patient.   Past Surgical History:  Procedure Laterality Date   HERNIA REPAIR         Home Medications    Prior to Admission medications   Not on File    Family History Family History  Problem Relation Age of Onset   Healthy Mother    Healthy Father     Social History Social History   Tobacco Use   Smoking status: Never   Smokeless tobacco: Never  Vaping Use   Vaping status: Never Used  Substance Use Topics   Alcohol use: Yes    Comment: social   Drug use: No     Allergies   Patient has no known allergies.   Review of Systems Review of Systems  Constitutional:  Negative for chills and fever.  HENT:  Negative for ear pain and sore throat.   Eyes:  Negative for pain and visual disturbance.  Respiratory:  Negative for cough and shortness of breath.    Cardiovascular:  Negative for chest pain and palpitations.  Gastrointestinal:  Negative for abdominal pain, diarrhea, nausea and vomiting.  Genitourinary:  Positive for penile discharge and penile pain. Negative for difficulty urinating, dysuria, frequency, hematuria and urgency.       Left groin soreness  Musculoskeletal:  Negative for arthralgias and back pain.  Skin:  Negative for color change and rash.  Neurological:  Negative for seizures and syncope.  All other systems reviewed and are negative.    Physical Exam Triage Vital Signs ED Triage Vitals  Encounter Vitals Group     BP 04/02/24 1759 134/87     Systolic BP Percentile --      Diastolic BP Percentile --      Pulse Rate 04/02/24 1759 87     Resp 04/02/24 1759 18     Temp 04/02/24 1759 98.2 F (36.8 C)     Temp Source 04/02/24 1759 Oral     SpO2 04/02/24 1759 97 %     Weight --      Height --      Head Circumference --      Peak Flow --      Pain Score 04/02/24 1757 0  Pain Loc --      Pain Education --      Exclude from Growth Chart --    No data found.  Updated Vital Signs BP 134/87 (BP Location: Left Arm)   Pulse 87   Temp 98.2 F (36.8 C) (Oral)   Resp 18   SpO2 97%   Visual Acuity Right Eye Distance:   Left Eye Distance:   Bilateral Distance:    Right Eye Near:   Left Eye Near:    Bilateral Near:     Physical Exam Vitals and nursing note reviewed.  Constitutional:      General: He is not in acute distress.    Appearance: He is well-developed.  HENT:     Head: Normocephalic and atraumatic.     Mouth/Throat:     Mouth: Mucous membranes are moist.  Eyes:     Conjunctiva/sclera: Conjunctivae normal.  Cardiovascular:     Rate and Rhythm: Normal rate and regular rhythm.     Heart sounds: No murmur heard. Pulmonary:     Effort: Pulmonary effort is normal. No respiratory distress.     Breath sounds: Normal breath sounds.  Abdominal:     Palpations: Abdomen is soft.     Tenderness:  There is no abdominal tenderness.  Musculoskeletal:        General: No swelling.     Cervical back: Neck supple.  Skin:    General: Skin is warm and dry.     Capillary Refill: Capillary refill takes less than 2 seconds.  Neurological:     Mental Status: He is alert.  Psychiatric:        Mood and Affect: Mood normal.      UC Treatments / Results  Labs (all labs ordered are listed, but only abnormal results are displayed) Labs Reviewed  POCT URINALYSIS DIP (MANUAL ENTRY) - Abnormal; Notable for the following components:      Result Value   Blood, UA trace-intact (*)    All other components within normal limits  URINE CULTURE  CYTOLOGY, (ORAL, ANAL, URETHRAL) ANCILLARY ONLY    EKG   Radiology No results found.  Procedures Procedures (including critical care time)  Medications Ordered in UC Medications - No data to display  Initial Impression / Assessment and Plan / UC Course  I have reviewed the triage vital signs and the nursing notes.  Pertinent labs & imaging results that were available during my care of the patient were reviewed by me and considered in my medical decision making (see chart for details).     Dysuria  Screening examination for STI   Urinalysis done today is negative for infection however there is a trace amount of red blood cells.  This could be due to irritation of the urethra.  We will send the urine off for a urine culture.  If this does show bacteria in the urine, we will contact you to start antibiotics. This normally takes 24-48 hours to get results. Screening swab done today and results will be available in 24-48 hours. We will contact you if we need to arrange additional treatment based on your testing. Negative results will be on your MyChart account. Abstain from sex until you receive your final results.  Use a condom for sexual encounters.  Make sure to stay hydrated and drink plenty of water.  Final Clinical Impressions(s) / UC  Diagnoses   Final diagnoses:  Dysuria  Screening examination for STI     Discharge Instructions  Urinalysis done today is negative for infection however there is a trace amount of red blood cells.  This could be due to irritation of the urethra.  We will send the urine off for a urine culture.  If this does show bacteria in the urine, we will contact you to start antibiotics. This normally takes 24-48 hours to get results. Screening swab done today and results will be available in 24-48 hours. We will contact you if we need to arrange additional treatment based on your testing. Negative results will be on your MyChart account. Abstain from sex until you receive your final results.  Use a condom for sexual encounters.  Make sure to stay hydrated and drink plenty of water.   ED Prescriptions   None    PDMP not reviewed this encounter.   Kreg Pesa, PA-C 04/02/24 1844    Kreg Pesa, PA-C 04/02/24 1845

## 2024-04-03 ENCOUNTER — Ambulatory Visit (HOSPITAL_COMMUNITY): Payer: Self-pay

## 2024-04-03 LAB — CYTOLOGY, (ORAL, ANAL, URETHRAL) ANCILLARY ONLY
Chlamydia: NEGATIVE
Comment: NEGATIVE
Comment: NEGATIVE
Comment: NORMAL
Neisseria Gonorrhea: NEGATIVE
Trichomonas: POSITIVE — AB

## 2024-04-03 LAB — URINE CULTURE: Culture: NO GROWTH

## 2024-04-03 MED ORDER — METRONIDAZOLE 500 MG PO TABS: 2000.0000 mg | ORAL_TABLET | Freq: Once | ORAL | 0 refills | Status: AC
